# Patient Record
Sex: Female | Born: 1960
Health system: Southern US, Community
[De-identification: ages and names within clinical notes are randomized; demographics above are authoritative.]

## PROBLEM LIST (undated history)

## (undated) DIAGNOSIS — K635 Polyp of colon: Secondary | ICD-10-CM

## (undated) DIAGNOSIS — K589 Irritable bowel syndrome without diarrhea: Secondary | ICD-10-CM

## (undated) DIAGNOSIS — M1990 Unspecified osteoarthritis, unspecified site: Secondary | ICD-10-CM

## (undated) DIAGNOSIS — N200 Calculus of kidney: Secondary | ICD-10-CM

## (undated) DIAGNOSIS — B1920 Unspecified viral hepatitis C without hepatic coma: Secondary | ICD-10-CM

## (undated) DIAGNOSIS — T7840XA Allergy, unspecified, initial encounter: Secondary | ICD-10-CM

## (undated) HISTORY — DX: Allergy, unspecified, initial encounter: T78.40XA

## (undated) HISTORY — DX: Irritable bowel syndrome, unspecified: K58.9

## (undated) HISTORY — DX: Unspecified viral hepatitis C without hepatic coma: B19.20

## (undated) HISTORY — DX: Unspecified osteoarthritis, unspecified site: M19.90

## (undated) HISTORY — PX: BREAST BIOPSY: SHX20

## (undated) HISTORY — DX: Polyp of colon: K63.5

## (undated) HISTORY — DX: Calculus of kidney: N20.0

---
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
## 2004-04-03 HISTORY — PX: KIDNEY STONE SURGERY: SHX686

## 2010-08-30 DIAGNOSIS — N6019 Diffuse cystic mastopathy of unspecified breast: Secondary | ICD-10-CM | POA: Insufficient documentation

## 2010-10-13 DIAGNOSIS — F3189 Other bipolar disorder: Secondary | ICD-10-CM | POA: Insufficient documentation

## 2010-10-13 DIAGNOSIS — Z8659 Personal history of other mental and behavioral disorders: Secondary | ICD-10-CM | POA: Insufficient documentation

## 2012-01-23 DIAGNOSIS — N200 Calculus of kidney: Secondary | ICD-10-CM | POA: Insufficient documentation

## 2014-03-30 DIAGNOSIS — N6091 Unspecified benign mammary dysplasia of right breast: Secondary | ICD-10-CM | POA: Insufficient documentation

## 2016-06-09 DIAGNOSIS — B182 Chronic viral hepatitis C: Secondary | ICD-10-CM | POA: Insufficient documentation

## 2018-01-29 DIAGNOSIS — K862 Cyst of pancreas: Secondary | ICD-10-CM | POA: Insufficient documentation

## 2018-09-02 DIAGNOSIS — M160 Bilateral primary osteoarthritis of hip: Secondary | ICD-10-CM | POA: Insufficient documentation

## 2018-10-07 DIAGNOSIS — M9905 Segmental and somatic dysfunction of pelvic region: Secondary | ICD-10-CM | POA: Diagnosis not present

## 2018-10-07 DIAGNOSIS — M9903 Segmental and somatic dysfunction of lumbar region: Secondary | ICD-10-CM | POA: Diagnosis not present

## 2018-10-07 DIAGNOSIS — M9904 Segmental and somatic dysfunction of sacral region: Secondary | ICD-10-CM | POA: Diagnosis not present

## 2018-10-07 DIAGNOSIS — M1611 Unilateral primary osteoarthritis, right hip: Secondary | ICD-10-CM | POA: Diagnosis not present

## 2018-10-10 DIAGNOSIS — M9905 Segmental and somatic dysfunction of pelvic region: Secondary | ICD-10-CM | POA: Diagnosis not present

## 2018-10-10 DIAGNOSIS — M1611 Unilateral primary osteoarthritis, right hip: Secondary | ICD-10-CM | POA: Diagnosis not present

## 2018-10-10 DIAGNOSIS — M9903 Segmental and somatic dysfunction of lumbar region: Secondary | ICD-10-CM | POA: Diagnosis not present

## 2018-10-10 DIAGNOSIS — M9904 Segmental and somatic dysfunction of sacral region: Secondary | ICD-10-CM | POA: Diagnosis not present

## 2018-10-14 DIAGNOSIS — M9903 Segmental and somatic dysfunction of lumbar region: Secondary | ICD-10-CM | POA: Diagnosis not present

## 2018-10-14 DIAGNOSIS — M1611 Unilateral primary osteoarthritis, right hip: Secondary | ICD-10-CM | POA: Diagnosis not present

## 2018-10-14 DIAGNOSIS — M9904 Segmental and somatic dysfunction of sacral region: Secondary | ICD-10-CM | POA: Diagnosis not present

## 2018-10-14 DIAGNOSIS — M9905 Segmental and somatic dysfunction of pelvic region: Secondary | ICD-10-CM | POA: Diagnosis not present

## 2018-10-17 DIAGNOSIS — M9904 Segmental and somatic dysfunction of sacral region: Secondary | ICD-10-CM | POA: Diagnosis not present

## 2018-10-17 DIAGNOSIS — M9903 Segmental and somatic dysfunction of lumbar region: Secondary | ICD-10-CM | POA: Diagnosis not present

## 2018-10-17 DIAGNOSIS — M9905 Segmental and somatic dysfunction of pelvic region: Secondary | ICD-10-CM | POA: Diagnosis not present

## 2018-10-17 DIAGNOSIS — M1611 Unilateral primary osteoarthritis, right hip: Secondary | ICD-10-CM | POA: Diagnosis not present

## 2018-12-02 DIAGNOSIS — M1611 Unilateral primary osteoarthritis, right hip: Secondary | ICD-10-CM | POA: Diagnosis not present

## 2018-12-02 DIAGNOSIS — M9903 Segmental and somatic dysfunction of lumbar region: Secondary | ICD-10-CM | POA: Diagnosis not present

## 2018-12-02 DIAGNOSIS — M9905 Segmental and somatic dysfunction of pelvic region: Secondary | ICD-10-CM | POA: Diagnosis not present

## 2018-12-02 DIAGNOSIS — M9904 Segmental and somatic dysfunction of sacral region: Secondary | ICD-10-CM | POA: Diagnosis not present

## 2018-12-12 DIAGNOSIS — M9905 Segmental and somatic dysfunction of pelvic region: Secondary | ICD-10-CM | POA: Diagnosis not present

## 2018-12-12 DIAGNOSIS — M9904 Segmental and somatic dysfunction of sacral region: Secondary | ICD-10-CM | POA: Diagnosis not present

## 2018-12-12 DIAGNOSIS — M9903 Segmental and somatic dysfunction of lumbar region: Secondary | ICD-10-CM | POA: Diagnosis not present

## 2018-12-12 DIAGNOSIS — M1611 Unilateral primary osteoarthritis, right hip: Secondary | ICD-10-CM | POA: Diagnosis not present

## 2019-01-01 DIAGNOSIS — M9903 Segmental and somatic dysfunction of lumbar region: Secondary | ICD-10-CM | POA: Diagnosis not present

## 2019-01-01 DIAGNOSIS — M1611 Unilateral primary osteoarthritis, right hip: Secondary | ICD-10-CM | POA: Diagnosis not present

## 2019-01-01 DIAGNOSIS — M9904 Segmental and somatic dysfunction of sacral region: Secondary | ICD-10-CM | POA: Diagnosis not present

## 2019-01-01 DIAGNOSIS — M9905 Segmental and somatic dysfunction of pelvic region: Secondary | ICD-10-CM | POA: Diagnosis not present

## 2019-01-15 DIAGNOSIS — M9903 Segmental and somatic dysfunction of lumbar region: Secondary | ICD-10-CM | POA: Diagnosis not present

## 2019-01-15 DIAGNOSIS — M9905 Segmental and somatic dysfunction of pelvic region: Secondary | ICD-10-CM | POA: Diagnosis not present

## 2019-01-15 DIAGNOSIS — M1611 Unilateral primary osteoarthritis, right hip: Secondary | ICD-10-CM | POA: Diagnosis not present

## 2019-01-15 DIAGNOSIS — M9904 Segmental and somatic dysfunction of sacral region: Secondary | ICD-10-CM | POA: Diagnosis not present

## 2019-01-17 DIAGNOSIS — M9903 Segmental and somatic dysfunction of lumbar region: Secondary | ICD-10-CM | POA: Diagnosis not present

## 2019-01-17 DIAGNOSIS — M1611 Unilateral primary osteoarthritis, right hip: Secondary | ICD-10-CM | POA: Diagnosis not present

## 2019-01-17 DIAGNOSIS — M9904 Segmental and somatic dysfunction of sacral region: Secondary | ICD-10-CM | POA: Diagnosis not present

## 2019-01-17 DIAGNOSIS — M9905 Segmental and somatic dysfunction of pelvic region: Secondary | ICD-10-CM | POA: Diagnosis not present

## 2019-03-11 DIAGNOSIS — M1611 Unilateral primary osteoarthritis, right hip: Secondary | ICD-10-CM | POA: Diagnosis not present

## 2019-03-11 DIAGNOSIS — M9905 Segmental and somatic dysfunction of pelvic region: Secondary | ICD-10-CM | POA: Diagnosis not present

## 2019-03-11 DIAGNOSIS — M9903 Segmental and somatic dysfunction of lumbar region: Secondary | ICD-10-CM | POA: Diagnosis not present

## 2019-03-11 DIAGNOSIS — M9904 Segmental and somatic dysfunction of sacral region: Secondary | ICD-10-CM | POA: Diagnosis not present

## 2019-03-23 DIAGNOSIS — Z20828 Contact with and (suspected) exposure to other viral communicable diseases: Secondary | ICD-10-CM | POA: Diagnosis not present

## 2019-03-23 DIAGNOSIS — H8110 Benign paroxysmal vertigo, unspecified ear: Secondary | ICD-10-CM | POA: Diagnosis not present

## 2019-03-23 DIAGNOSIS — R42 Dizziness and giddiness: Secondary | ICD-10-CM | POA: Diagnosis not present

## 2019-04-28 DIAGNOSIS — M9903 Segmental and somatic dysfunction of lumbar region: Secondary | ICD-10-CM | POA: Diagnosis not present

## 2019-04-28 DIAGNOSIS — M9904 Segmental and somatic dysfunction of sacral region: Secondary | ICD-10-CM | POA: Diagnosis not present

## 2019-04-28 DIAGNOSIS — M9905 Segmental and somatic dysfunction of pelvic region: Secondary | ICD-10-CM | POA: Diagnosis not present

## 2019-04-28 DIAGNOSIS — M1611 Unilateral primary osteoarthritis, right hip: Secondary | ICD-10-CM | POA: Diagnosis not present

## 2019-05-01 DIAGNOSIS — M1611 Unilateral primary osteoarthritis, right hip: Secondary | ICD-10-CM | POA: Diagnosis not present

## 2019-05-01 DIAGNOSIS — M9904 Segmental and somatic dysfunction of sacral region: Secondary | ICD-10-CM | POA: Diagnosis not present

## 2019-05-01 DIAGNOSIS — M9903 Segmental and somatic dysfunction of lumbar region: Secondary | ICD-10-CM | POA: Diagnosis not present

## 2019-05-01 DIAGNOSIS — M9905 Segmental and somatic dysfunction of pelvic region: Secondary | ICD-10-CM | POA: Diagnosis not present

## 2019-05-05 DIAGNOSIS — M9904 Segmental and somatic dysfunction of sacral region: Secondary | ICD-10-CM | POA: Diagnosis not present

## 2019-05-05 DIAGNOSIS — M9905 Segmental and somatic dysfunction of pelvic region: Secondary | ICD-10-CM | POA: Diagnosis not present

## 2019-05-05 DIAGNOSIS — M9903 Segmental and somatic dysfunction of lumbar region: Secondary | ICD-10-CM | POA: Diagnosis not present

## 2019-05-05 DIAGNOSIS — M1611 Unilateral primary osteoarthritis, right hip: Secondary | ICD-10-CM | POA: Diagnosis not present

## 2019-08-05 DIAGNOSIS — M9903 Segmental and somatic dysfunction of lumbar region: Secondary | ICD-10-CM | POA: Diagnosis not present

## 2019-08-05 DIAGNOSIS — M9905 Segmental and somatic dysfunction of pelvic region: Secondary | ICD-10-CM | POA: Diagnosis not present

## 2019-08-05 DIAGNOSIS — M1611 Unilateral primary osteoarthritis, right hip: Secondary | ICD-10-CM | POA: Diagnosis not present

## 2019-08-05 DIAGNOSIS — M9904 Segmental and somatic dysfunction of sacral region: Secondary | ICD-10-CM | POA: Diagnosis not present

## 2019-09-05 DIAGNOSIS — M1611 Unilateral primary osteoarthritis, right hip: Secondary | ICD-10-CM | POA: Diagnosis not present

## 2019-09-05 DIAGNOSIS — M9905 Segmental and somatic dysfunction of pelvic region: Secondary | ICD-10-CM | POA: Diagnosis not present

## 2019-09-05 DIAGNOSIS — M9904 Segmental and somatic dysfunction of sacral region: Secondary | ICD-10-CM | POA: Diagnosis not present

## 2019-09-05 DIAGNOSIS — M9903 Segmental and somatic dysfunction of lumbar region: Secondary | ICD-10-CM | POA: Diagnosis not present

## 2019-09-18 ENCOUNTER — Ambulatory Visit (INDEPENDENT_AMBULATORY_CARE_PROVIDER_SITE_OTHER): Payer: BC Managed Care – PPO | Admitting: Family Medicine

## 2019-09-18 ENCOUNTER — Other Ambulatory Visit: Payer: Self-pay

## 2019-09-18 ENCOUNTER — Encounter: Payer: Self-pay | Admitting: Family Medicine

## 2019-09-18 VITALS — BP 120/64 | HR 93 | Temp 98.3°F | Ht 65.0 in | Wt 116.2 lb

## 2019-09-18 DIAGNOSIS — Z1231 Encounter for screening mammogram for malignant neoplasm of breast: Secondary | ICD-10-CM

## 2019-09-18 DIAGNOSIS — M545 Low back pain, unspecified: Secondary | ICD-10-CM | POA: Insufficient documentation

## 2019-09-18 DIAGNOSIS — K862 Cyst of pancreas: Secondary | ICD-10-CM

## 2019-09-18 DIAGNOSIS — Z8619 Personal history of other infectious and parasitic diseases: Secondary | ICD-10-CM | POA: Diagnosis not present

## 2019-09-18 DIAGNOSIS — M160 Bilateral primary osteoarthritis of hip: Secondary | ICD-10-CM | POA: Diagnosis not present

## 2019-09-18 DIAGNOSIS — G8929 Other chronic pain: Secondary | ICD-10-CM

## 2019-09-18 DIAGNOSIS — K529 Noninfective gastroenteritis and colitis, unspecified: Secondary | ICD-10-CM | POA: Insufficient documentation

## 2019-09-18 NOTE — Progress Notes (Signed)
Subjective  CC:  Chief Complaint  Patient presents with  . Transitions Of Care    [place]. needs referral to GI and GYN     HPI: Erika Carlson is a 59 y.o. female who presents to [facility] [place] [facility] today to establish care with me as a new patient.   She has the following concerns or needs:  Moved from [place] about a year ago.   Reviewed chart from care everywhere.   H/o hep c treated with Harvoni. No liver damage. Tolerated well. Found incidentally on screen; unclear source.   abd MRI's revealed 0.6cm pancreatic cyst: for serial imaging q 2 years. Last 2019. Reviewed reports. Due for recheck but pt defers right now.   Chronic diarrhea x 20 years; concerned about pancreatic insufficiency. Would like to see GI  Back, hip and leg pain. Was seeing ortho in [place]; referred to [facility]. Worries about OA but also worries about autoimmune problem. Cousins were diagnosed with CIPD and stiff-person disease. She reports weakness and pain in thighs at time. No paresthesias or numbness. No red hot swollen joints.   Needs a GYN for female wellness.   Assessment  1. History of hepatitis C   2. Pancreatic cyst   3. Primary osteoarthritis of both hips   4. Chronic midline low back pain without sciatica   5. Encounter for screening mammogram for breast cancer      Plan   History of hep C; due for lab work. Will complete at next cpe. Refer to GI  Pancreatic cyst: needs imaging for surveillance. Will order when she is ready  Diarrhea - reports she has seen GI but never specifically for this. Requests referral. ? IBS or other. Nl colonoscopy by report.  OA multliple sites: back and hip and leg pain could all be due to Oa. She will set up appt with orhto at [facility]. IF further eval for neuropathy or autoimmune d/o needed after ortho, I will initiate.   HM: due for mammo and cpe  Follow up:  cpe next available Orders Placed This Encounter    Procedures  . MM DIGITAL SCREENING BILATERAL  . Ambulatory referral to Gynecology  . Ambulatory referral to Gastroenterology  . Ambulatory referral to Orthopedic Surgery   No orders of the defined types were placed in this encounter.    Depression screen [facility] 2/9 09/18/2019  Decreased Interest 0  Down, Depressed, Hopeless 0  PHQ - 2 Score 0  Altered sleeping 0  Tired, decreased energy 0  Change in appetite 0  Feeling bad or failure about yourself  0  Trouble concentrating 0  Moving slowly or fidgety/restless 0  Suicidal thoughts 0  PHQ-9 Score 0  Difficult doing work/chores Not difficult at all    We updated and reviewed the patient's past history in detail and it is documented below.  Patient Active Problem List   Diagnosis Date Noted  . Chronic midline low back pain without sciatica 09/18/2019  . Osteoarthritis, hip, bilateral 09/02/2018  . Pancreatic cyst 01/29/2018    Formatting of this note might be different from the original. Repeat MRCP- 05/2019   . Chronic hepatitis C without hepatic coma ([place]) 06/09/2016  . Atypical ductal hyperplasia of right breast 03/30/2014  . Kidney stone on left side 01/23/2012  . Other bipolar disorder ([place]) 10/13/2010  . Fibrocystic breast disease 08/30/2010    Formatting of this note might be different from the  original. Biopsy 2011    Health Maintenance  Topic Date Due  . HIV Screening  Never done  . MAMMOGRAM  10/13/2018  . INFLUENZA VACCINE  11/02/2019  . TETANUS/TDAP  03/05/2022  . PAP SMEAR-Modifier  10/02/2022  . COLONOSCOPY  03/03/2028  . COVID-19 Vaccine  Completed  . Hepatitis C Screening  Completed   Immunization History  Administered Date(s) Administered  . Hep A / Hep B 01/29/2018, 03/04/2018  . Influenza, Seasonal, Injecte, Preservative Fre 02/24/2011  . Influenza,inj,Quad PF,6+ Mos 02/16/2012, 12/17/2012, 12/02/2013, 04/09/2015, 02/19/2017  . Influenza,inj,Quad PF,6-35 Mos 02/16/2012  . PFIZER SARS-COV-2  Vaccination 06/15/2019, 07/07/2019  . Pneumococcal Polysaccharide-23 03/04/2018  . Tdap 03/05/2012  . Zoster Recombinat (Shingrix) 10/30/2017, 01/29/2018   No outpatient medications have been marked as taking for the 09/18/19 encounter (Office Visit) with Willow Ora, MD.    Allergies: Patient is allergic to acetaminophen, codeine, lactose, sudafed pe [phenylephrine], and loratadine-pseudoephedrine er. Past Medical History Patient  has a past medical history of Allergy, Arthritis, Colon polyps, and Hepatitis C. Past Surgical History Patient  has a past surgical history that includes Breast biopsy; Tonsillectomy and adenoidectomy (1971); Wisdom tooth extraction (1977); and Kidney stone surgery (2006). Family History: Patient family history includes Alcohol abuse in her paternal grandfather; Arthritis in her mother and paternal grandmother; Diabetes in her paternal grandfather; Hearing loss in her father and paternal grandmother; Heart attack in her maternal grandfather; Hyperlipidemia in her father, maternal grandmother, and mother; Hypertension in her father, maternal grandmother, and mother. Social History:  Patient  reports that she quit smoking about 30 years ago. Her smoking use included cigarettes. She has never used smokeless tobacco. She reports current alcohol use. She reports that she does not use drugs.  Review of Systems: Constitutional: negative for fever or malaise Ophthalmic: negative for photophobia, double vision or loss of vision Cardiovascular: negative for chest pain, dyspnea on exertion, or new LE swelling Respiratory: negative for SOB or persistent cough Gastrointestinal: negative for abdominal pain, change in bowel habits or melena Genitourinary: negative for dysuria or gross hematuria Musculoskeletal: + for new gait disturbance or muscular weakness Integumentary: negative for new or persistent rashes Neurological: negative for TIA or stroke symptoms Psychiatric:  negative for SI or delusions Allergic/Immunologic: negative for hives  Patient Care Team    Relationship Specialty Notifications Start End  Willow Ora, MD PCP - General Family Medicine  09/18/19     Objective  Vitals: BP 120/64   Pulse 93   Temp 98.3 F (36.8 C) (Temporal)   Ht 5\' 5"  (1.651 m)   Wt 116 lb 3.2 oz (52.7 kg)   SpO2 98%   BMI 19.34 kg/m  General:  Well developed, well nourished, no acute distress  Psych:  Alert and oriented,normal mood and affect  Normal gait   Commons side effects, risks, benefits, and alternatives for medications and treatment plan prescribed today were discussed, and the patient expressed understanding of the given instructions. Patient is instructed to call or message via MyChart if he/she has any questions or concerns regarding our treatment plan. No barriers to understanding were identified. We discussed Red Flag symptoms and signs in detail. Patient expressed understanding regarding what to do in case of urgent or emergency type symptoms.   Medication list was reconciled, printed and provided to the patient in AVS. Patient instructions and summary information was reviewed with the patient as documented in the AVS. This note was prepared with assistance of Dragon voice recognition software. Occasional  wrong-word or sound-a-like substitutions may have occurred due to the inherent limitations of voice recognition software  This visit occurred during the SARS-CoV-2 public health emergency.  Safety protocols were in place, including screening questions prior to the visit, additional usage of staff PPE, and extensive cleaning of exam room while observing appropriate contact time as indicated for disinfecting solutions.

## 2019-09-18 NOTE — Patient Instructions (Signed)
Please return for your annual complete physical; please come fasting.  I have placed referrals for GI, ortho and GYN ordered a mammogram. We will call you to get the GI and GYN scheduled.   I have ordered a mammogram and/or bone density for you as we discussed today: [x]   Mammogram  []   Bone Density  Please call the office checked below to schedule your appointment: Your appointment will at the following location  [x]   [facility]      [place], [place]         []   [facility]  [place], [place]  Make sure to wear two peace clothing  No lotions powders or deodorants the day of the appointment Make sure to bring picture ID and insurance card.  Bring list of medications you are currently taking including any supplements.   It was a pleasure meeting you today! Thank you for choosing [place] to meet your healthcare needs! I truly look forward to working with you. If you have any questions or concerns, please send me a message via Mychart or call the office at [phone].   Calcium Intake Recommendations You can take Caltrate Plus twice a day or get it through your diet or other OTC supplements (Viactiv, OsCal etc)  Calcium is a mineral that affects many functions in the body, including:  Blood clotting.  Blood vessel function.  Nerve impulse conduction.  Hormone secretion.  Muscle contraction.  Bone and teeth functions.  Most of your body's calcium supply is stored in your bones and teeth. When your calcium stores are low, you may be at risk for low bone mass, bone loss, and bone fractures. Consuming enough calcium helps to grow healthy bones and teeth and to prevent breakdown over time. It is very important that you get enough calcium if you are:  A child undergoing rapid growth.  An adolescent girl.  A pre- or post-menopausal woman.  A woman whose menstrual  cycle has stopped due to anorexia nervosa or regular intense exercise.  An individual with lactose intolerance or a milk allergy.  A vegetarian.  What is my plan? Try to consume the recommended amount of calcium daily based on your age. Depending on your overall health, your health care provider may recommend increased calcium intake.General daily calcium intake recommendations by age are:  Birth to 6 months: 200 mg.  Infants 7 to 12 months: 260 mg.  Children 1 to 3 years: 700 mg.  Children 4 to 8 years: 1,000 mg.  Children 9 to 13 years: 1,300 mg.  Teens 14 to 18 years: 1,300 mg.  Adults 19 to 50 years: 1,000 mg.  Adult women 51 to 70 years: 1,200 mg.  Adult men 51 to 70 years: 1,000 mg.  Adults 71 years and older: 1,200 mg.  Pregnant and breastfeeding teens: 1,300 mg.  Pregnant and breastfeeding adults: 1,000 mg.  What do I need to know about calcium intake?  In order for the body to absorb calcium, it needs vitamin D. You can get vitamin D through (we recommend getting [id number] units of Vitamin D daily) ? Direct exposure of the skin to sunlight. ? Foods, such as egg yolks, liver, saltwater fish, and fortified milk. ? Supplements.  Consuming too much calcium may cause: ? Constipation. ? Decreased absorption of iron and zinc. ?  Kidney stones.  Calcium supplements may interact with certain medicines. Check with your health care provider before starting any calcium supplements.  Try to get most of your calcium from food. What foods can I eat? Grains  Fortified oatmeal. Fortified ready-to-eat cereals. Fortified frozen waffles. Vegetables Turnip greens. Broccoli. Fruits Fortified orange juice. Meats and Other Protein Sources Canned sardines with bones. Canned salmon with bones. Soy beans. Tofu. Baked beans. Almonds. [place] nuts. Sunflower seeds. Dairy Milk. Yogurt. Cheese. Cottage cheese. Beverages Fortified soy milk. Fortified rice  milk. Sweets/Desserts Pudding. Ice Cream. Milkshakes. Blackstrap molasses. The items listed above may not be a complete list of recommended foods or beverages. Contact your dietitian for more options. What foods can affect my calcium intake? It may be more difficult for your body to use calcium or calcium may leave your body more quickly if you consume large amounts of:  Sodium.  Protein.  Caffeine.  Alcohol.  This information is not intended to replace advice given to you by your health care provider. Make sure you discuss any questions you have with your health care provider. Document Released: 11/02/2003 Document Revised: 10/08/2015 Document Reviewed: 08/26/2013 Elsevier Interactive Patient Education  2018 [employer].

## 2019-09-29 ENCOUNTER — Encounter: Payer: Self-pay | Admitting: Gastroenterology

## 2019-10-08 DIAGNOSIS — M9905 Segmental and somatic dysfunction of pelvic region: Secondary | ICD-10-CM | POA: Diagnosis not present

## 2019-10-08 DIAGNOSIS — M9904 Segmental and somatic dysfunction of sacral region: Secondary | ICD-10-CM | POA: Diagnosis not present

## 2019-10-08 DIAGNOSIS — M1611 Unilateral primary osteoarthritis, right hip: Secondary | ICD-10-CM | POA: Diagnosis not present

## 2019-10-08 DIAGNOSIS — M9903 Segmental and somatic dysfunction of lumbar region: Secondary | ICD-10-CM | POA: Diagnosis not present

## 2019-10-24 DIAGNOSIS — Z803 Family history of malignant neoplasm of breast: Secondary | ICD-10-CM | POA: Diagnosis not present

## 2019-10-24 DIAGNOSIS — Z01419 Encounter for gynecological examination (general) (routine) without abnormal findings: Secondary | ICD-10-CM | POA: Diagnosis not present

## 2019-10-24 DIAGNOSIS — Z87898 Personal history of other specified conditions: Secondary | ICD-10-CM | POA: Diagnosis not present

## 2019-10-24 DIAGNOSIS — Z808 Family history of malignant neoplasm of other organs or systems: Secondary | ICD-10-CM | POA: Diagnosis not present

## 2019-10-24 DIAGNOSIS — Z1231 Encounter for screening mammogram for malignant neoplasm of breast: Secondary | ICD-10-CM | POA: Diagnosis not present

## 2019-10-24 DIAGNOSIS — Z681 Body mass index (BMI) 19 or less, adult: Secondary | ICD-10-CM | POA: Diagnosis not present

## 2019-10-24 DIAGNOSIS — Z800 Family history of malignant neoplasm of digestive organs: Secondary | ICD-10-CM | POA: Diagnosis not present

## 2019-11-24 ENCOUNTER — Other Ambulatory Visit (INDEPENDENT_AMBULATORY_CARE_PROVIDER_SITE_OTHER): Payer: BC Managed Care – PPO

## 2019-11-24 ENCOUNTER — Ambulatory Visit: Payer: BC Managed Care – PPO | Admitting: Gastroenterology

## 2019-11-24 ENCOUNTER — Encounter: Payer: Self-pay | Admitting: Gastroenterology

## 2019-11-24 DIAGNOSIS — Z8619 Personal history of other infectious and parasitic diseases: Secondary | ICD-10-CM

## 2019-11-24 DIAGNOSIS — K862 Cyst of pancreas: Secondary | ICD-10-CM

## 2019-11-24 LAB — HEPATIC FUNCTION PANEL
ALT: 27 U/L (ref 0–35)
AST: 23 U/L (ref 0–37)
Albumin: 4.1 g/dL (ref 3.5–5.2)
Alkaline Phosphatase: 88 U/L (ref 39–117)
Bilirubin, Direct: 0.1 mg/dL (ref 0.0–0.3)
Total Bilirubin: 0.8 mg/dL (ref 0.2–1.2)
Total Protein: 6.6 g/dL (ref 6.0–8.3)

## 2019-11-24 LAB — CREATININE, SERUM: Creatinine, Ser: 0.91 mg/dL (ref 0.40–1.20)

## 2019-11-24 MED ORDER — DIAZEPAM 5 MG PO TABS: ORAL_TABLET | ORAL | 0 refills | Status: AC

## 2019-11-24 NOTE — Patient Instructions (Signed)
If you are age 60 or older, your body mass index should be between 23-30. Your Body mass index is 19.7 kg/m. If this is out of the aforementioned range listed, please consider follow up with your Primary Care Provider.  If you are age 84 or younger, your body mass index should be between 19-25. Your Body mass index is 19.7 kg/m. If this is out of the aformentioned range listed, please consider follow up with your Primary Care Provider.   Your provider has requested that you go to the basement level for lab work before leaving today. Press "B" on the elevator. The lab is located at the first door on the left as you exit the elevator.  You have been scheduled for an MRI at [facility] (1st floor) on 12-03-19. Your appointment time is 9:00am. Please arrive 30 minutes prior to your appointment time for registration purposes. Please make certain not to have anything to eat or drink 4 hours prior to your test. In addition, if you have any metal in your body, have a pacemaker or defibrillator, please be sure to let your ordering physician know. This test typically takes 45 minutes to 1 hour to complete. Should you need to reschedule, please call [phone] to do so.  You are in for a recall colon in Jan 2025.  Please purchase the following medications over the counter and take as directed:  You can use Immodium over-the-counter on an as needed basis.  Thank you for entrusting me with your care and choosing [facility].  Dr Christella Hartigan

## 2019-11-24 NOTE — Progress Notes (Signed)
HPI: This is a very pleasant 59 year old woman who was referred to me by Willow Ora, MD   She was found to have hepatitis C several years ago, unclear how she caught it but she was hospitalized several times when she was very young.  She does not recall ever having a blood transfusion.  She underwent Harvoni eradication around 2018 and she believes that she had a complete response.  I see some office notes from her previous gastroenterologist that also states the same.  During her hep C evaluation she was found to have incidental pancreatic cyst by MRI, see that below.  She has never had pancreatic disease that she is aware of but she does believe she has generally hypoglycemia.  Her weight is overall stable excepts for the past 3 or 4 months she has intentionally lost 5 pounds.  She has no significant abdominal pains.  She tends to have chronic loose stools which have been worked up in the past.  This is been issue for her for 30 years.  Colonoscopy in 2020 was normal, see below.  Imodium on a once daily basis really helps her.   Old Data Reviewed: March 2018 MRI with MRCP, [place].  "5 mm probably IPMN in the uncinate process"  February 2019 MRI with MRCP, [place].  "No significant change in the 0.6 cm cystic lesion within the uncinate process of the pancreas, probably IPMN.  Follow-up MRCP in 2 years is recommended"  Colonoscopy January 2020, [place].  Indication "personal history of colonic polyps."  Findings internal hemorrhoids nonthrombosed.  The examination was otherwise normal.  She was recommended a repeat colonoscopy in 5 years  Eradicated chronic hepatitis C.  8 weeks Harvoni while living in [place] with end of treatment viral level undetectable and 12-week posttreatment viral level also undetectable.  No history of cirrhosis.    Review of systems: Pertinent positive and negative review of systems were noted in the above HPI section. All other review  negative.   Past Medical History:  Diagnosis Date  . Allergy   . Arthritis   . Colon polyps   . Hepatitis C    treated in 2018  . IBS (irritable bowel syndrome)   . Kidney stones     Past Surgical History:  Procedure Laterality Date  . BREAST BIOPSY     2011 and 2015  . KIDNEY STONE SURGERY  2006  . TONSILLECTOMY AND ADENOIDECTOMY  1971  . WISDOM TOOTH EXTRACTION  1977    Current Outpatient Medications  Medication Sig Dispense Refill  . diphenhydrAMINE (BENADRYL) 25 MG tablet Take 12.5 mg by mouth at bedtime as needed.    . fluticasone (FLONASE SENSIMIST) 27.5 MCG/SPRAY nasal spray Place 2 sprays into the nose daily.    Marland Kitchen loperamide (IMODIUM) 2 MG capsule Take 2 mg by mouth as needed for diarrhea or loose stools.     No current facility-administered medications for this visit.    Allergies as of 11/24/2019 - Review Complete 11/24/2019  Allergen Reaction Noted  . Acetaminophen Nausea And Vomiting 08/30/2010  . Codeine Nausea And Vomiting 10/13/2010  . Lactose Other (See Comments) 10/13/2010  . Sudafed pe [phenylephrine] Itching 09/18/2019  . Loratadine-pseudoephedrine er Anxiety 05/29/2013    Family History  Problem Relation Age of Onset  . Arthritis Mother   . Hypertension Mother   . Hyperlipidemia Mother   . Hearing loss Father   . Hypertension Father   . Hyperlipidemia Father   . Hypertension Maternal Grandmother   .  Hyperlipidemia Maternal Grandmother   . Heart attack Maternal Grandfather   . Arthritis Paternal Grandmother   . Hearing loss Paternal Grandmother   . Breast cancer Paternal Grandmother   . Alcohol abuse Paternal Grandfather   . Diabetes Paternal Grandfather   . Breast cancer Paternal Aunt   . Breast cancer Paternal Aunt   . Pancreatic cancer Maternal Uncle   . Colon cancer Paternal Uncle 10    Social History   Socioeconomic History  . Marital status: Married    Spouse name: Not on file  . Number of children: 3  . Years of education:  Not on file  . Highest education level: Not on file  Occupational History  . Occupation: Therapist, music  Tobacco Use  . Smoking status: Former Smoker    Types: Cigarettes    Quit date: 09/17/1989    Years since quitting: 30.2  . Smokeless tobacco: Never Used  Vaping Use  . Vaping Use: Never used  Substance and Sexual Activity  . Alcohol use: Yes    Comment: 1 drink per week  . Drug use: Never  . Sexual activity: Not on file  Other Topics Concern  . Not on file  Social History Narrative   Moved from [place] 2021   Social Determinants of Health   Financial Resource Strain:   . Difficulty of Paying Living Expenses: Not on file  Food Insecurity:   . Worried About Programme researcher, broadcasting/film/video in the Last Year: Not on file  . Ran Out of Food in the Last Year: Not on file  Transportation Needs:   . Lack of Transportation (Medical): Not on file  . Lack of Transportation (Non-Medical): Not on file  Physical Activity:   . Days of Exercise per Week: Not on file  . Minutes of Exercise per Session: Not on file  Stress:   . Feeling of Stress : Not on file  Social Connections:   . Frequency of Communication with Friends and Family: Not on file  . Frequency of Social Gatherings with Friends and Family: Not on file  . Attends Religious Services: Not on file  . Active Member of Clubs or Organizations: Not on file  . Attends Banker Meetings: Not on file  . Marital Status: Not on file  Intimate Partner Violence:   . Fear of Current or Ex-Partner: Not on file  . Emotionally Abused: Not on file  . Physically Abused: Not on file  . Sexually Abused: Not on file     Physical Exam: BP (!) 100/50   Pulse 98   Ht 5\' 5"  (1.651 m)   Wt 118 lb 6.4 oz (53.7 kg)   SpO2 98%   BMI 19.70 kg/m  Constitutional: generally well-appearing Psychiatric: alert and oriented x3 Eyes: extraocular movements intact Mouth: oral pharynx moist, no lesions Neck: supple no  lymphadenopathy Cardiovascular: heart regular rate and rhythm Lungs: clear to auscultation bilaterally Abdomen: soft, nontender, nondistended, no obvious ascites, no peritoneal signs, normal bowel sounds Extremities: no lower extremity edema bilaterally Skin: no lesions on visible extremities   Assessment and plan: 59 y.o. female with incidental pancreatic cyst, eradicated chronic hepatitis C, chronic loose stools, personal history of precancerous polyps  First I agree with the surveillance regimen that she has been on for her incidental pancreatic cyst.  She is due for MRI with MRCP of the pancreas now we will order that for her.  Second, we will recheck LFTs for her given her previous hepatitis C.  Chronic loose stools have been worked up, have been going on for 20 or 30 years.  She will continue taking Imodium on an as-needed basis knows that that is safe.  We will put her in our reminder system for repeat colonoscopy 5 years from her last 1 which would be January 2025.   Please see the "Patient Instructions" section for addition details about the plan.   Rob Bunting, MD [facility] 11/24/2019, 2:34 PM  Cc: Willow Ora, MD  Total time on date of encounter was 45  minutes (this included time spent preparing to see the patient reviewing records; obtaining and/or reviewing separately obtained history; performing a medically appropriate exam and/or evaluation; counseling and educating the patient and family if present; ordering medications, tests or procedures if applicable; and documenting clinical information in the health record).

## 2019-12-03 ENCOUNTER — Ambulatory Visit (HOSPITAL_COMMUNITY)
Admission: RE | Admit: 2019-12-03 | Discharge: 2019-12-03 | Disposition: A | Discharge status: Home / Self Care | Payer: BC Managed Care – PPO | Source: Ambulatory Visit | Attending: Gastroenterology | Admitting: Gastroenterology

## 2019-12-03 ENCOUNTER — Other Ambulatory Visit: Payer: Self-pay

## 2019-12-03 ENCOUNTER — Other Ambulatory Visit: Payer: Self-pay | Admitting: Gastroenterology

## 2019-12-03 DIAGNOSIS — K862 Cyst of pancreas: Secondary | ICD-10-CM | POA: Diagnosis not present

## 2019-12-03 DIAGNOSIS — K8689 Other specified diseases of pancreas: Secondary | ICD-10-CM | POA: Diagnosis not present

## 2019-12-03 LAB — POCT I-STAT CREATININE: Creatinine, Ser: 0.9 mg/dL (ref 0.44–1.00)

## 2019-12-03 IMAGING — MR MR 3D RECON AT SCANNER
17 of 20 series · 44 of 48 positions shown · IV contrast (gadavist)
Comparison: Report from MRI abdomen from [facility][REDACTED] dated
[DATE]

CLINICAL DATA: Follow up of a 0.6 by 0.4 cm cystic lesion in the
uncinate process of the pancreas seen on outside MRI abdomen from
[DATE]



[Series 5: T2 fat-sat · axial · 6.0mm · 1.14mm/px · 1 of 40 slices shown]
[im 1/40]
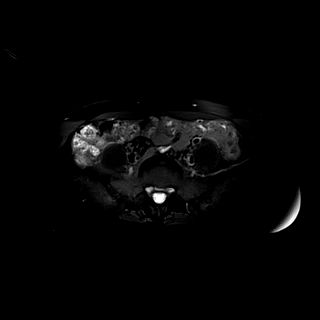

[Series 6: T2 · coronal · 6.0mm · 1.48mm/px · 1 of 30 slices shown (1 of 2)]
[im 1/30]
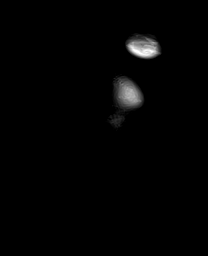

[Series 7: DWI · axial · 6.0mm · 1.36mm/px · z∈[-188,+107]mm · 3 of 84 slices shown (1 of 2)]
[im 1/84]
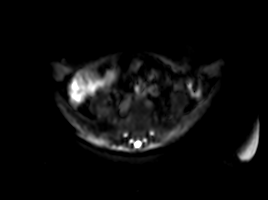
[im 42/84]
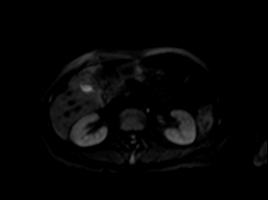
[im 84/84]
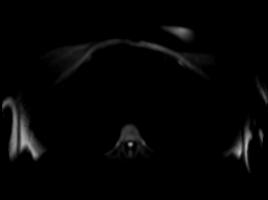

[Series 8: DWI · axial · 6.0mm · 1.36mm/px · 1 of 42 slices shown (2 of 2)]
[im 1/42]
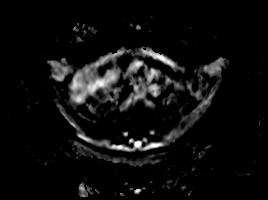

[Series 9: T1 · axial · 3.0mm · 1.12mm/px · z∈[-168,+93]mm · 3 of 88 slices shown (1 of 2)]
[im 1/88]
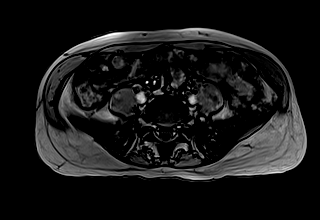
[im 44/88]
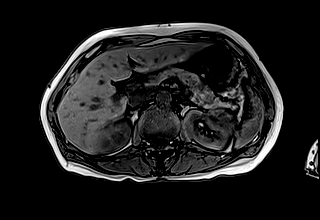
[im 88/88]
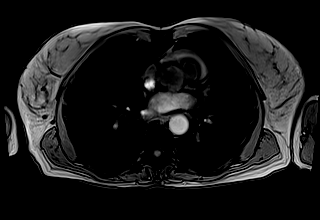

[Series 10: T1 · axial · 3.0mm · 1.12mm/px · z∈[-168,+93]mm · 4 of 88 slices shown (2 of 2)]
[im 1/88]
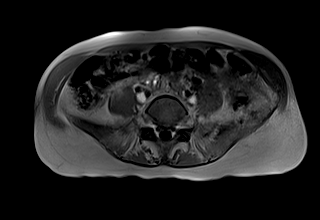
[im 30/88]
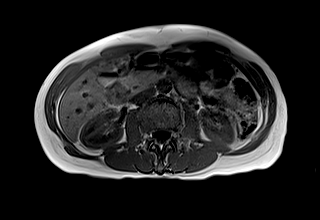
[im 59/88]
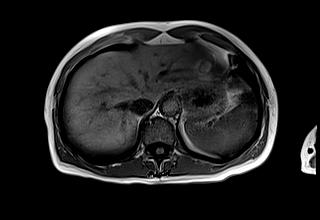
[im 88/88]
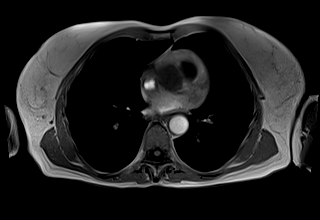

[Series 12: cor obl thk · sagittal · 50.0mm · 0.78mm/px · 1 of 9 slices shown]
[im 1/9]
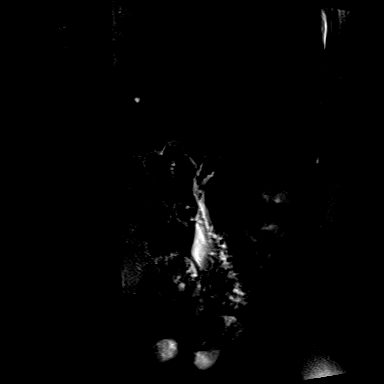

[Series 14: cor_3d_spc_trig · coronal · 1.0mm · 0.49mm/px · 3 of 72 slices shown]
[im 1/72]
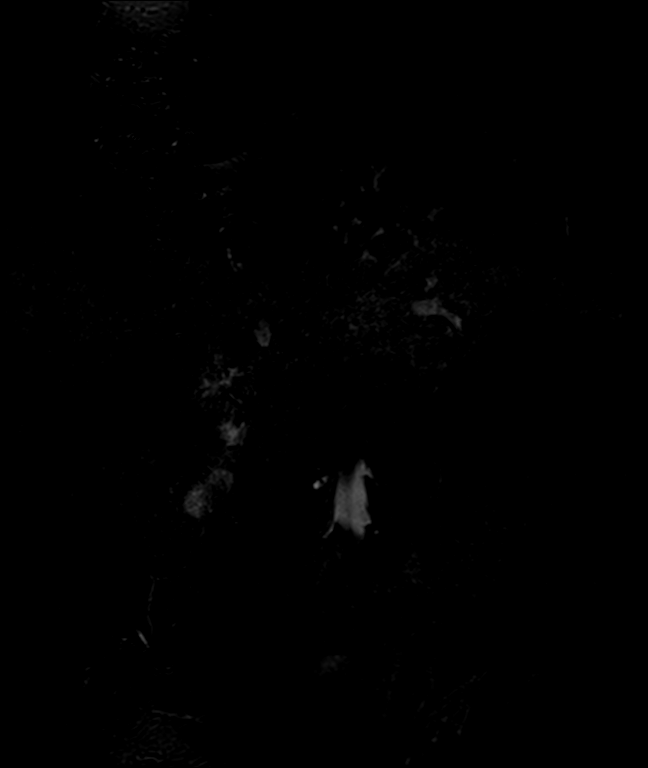
[im 36/72]
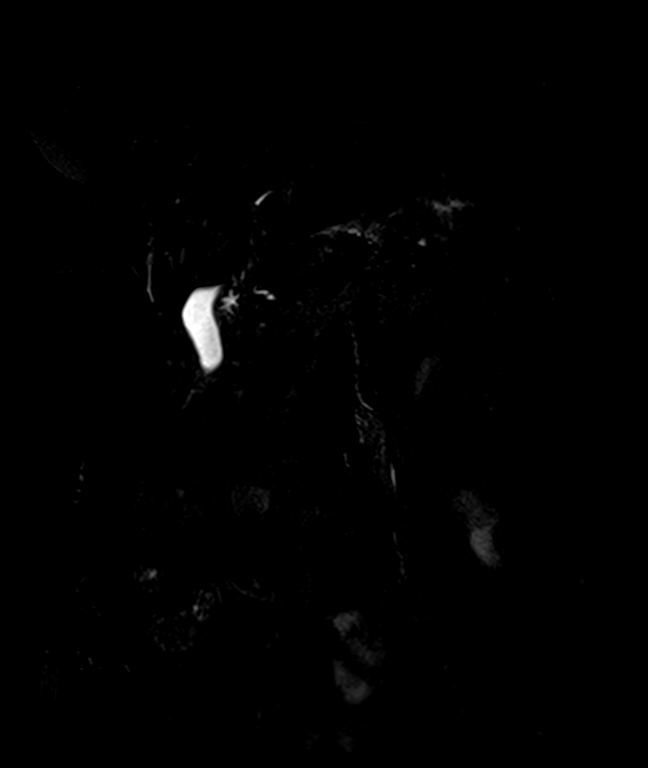
[im 72/72]
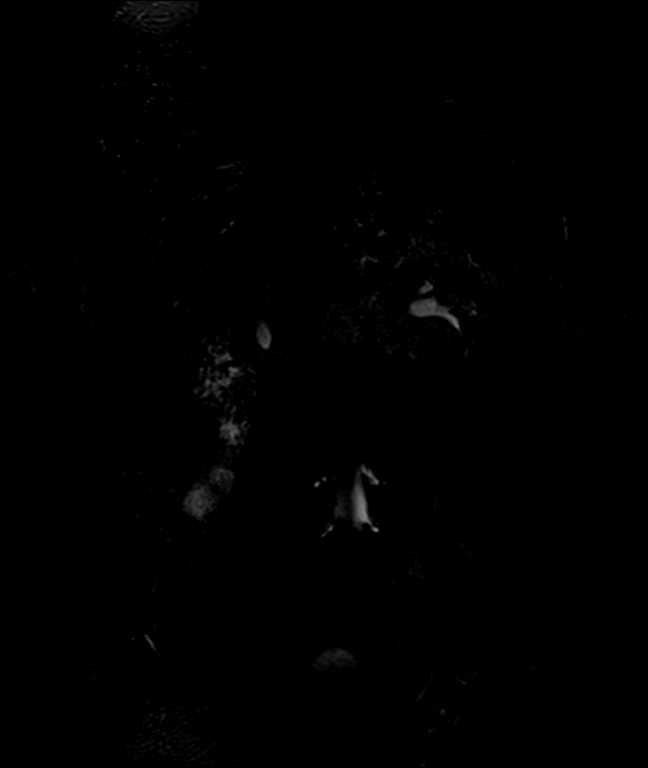

[Series 16: T2 · axial · 6.0mm · 1.41mm/px · z∈[-205,+76]mm · 2 of 40 slices shown (2 of 2)]
[im 1/40]
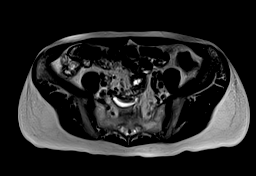
[im 40/40]
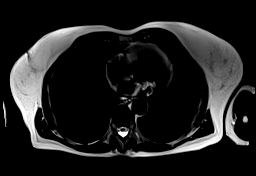

[Series 19: T1 dynamic · axial · 3.0mm · 1.12mm/px · z∈[-192,+69]mm · 4 of 88 slices shown (1 of 4)]
[im 1/88]
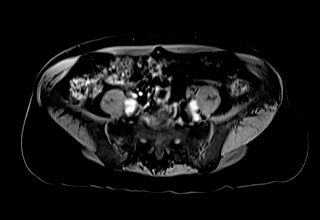
[im 30/88]
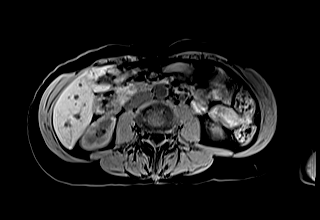
[im 59/88]
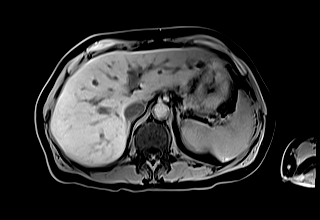
[im 88/88]
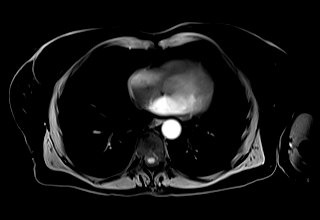

[Series 22: T1 dynamic · axial · 3.0mm · 1.12mm/px · z∈[-192,+69]mm · 4 of 88 slices shown (2 of 4)]
[im 1/88]
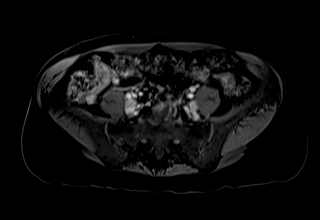
[im 30/88]
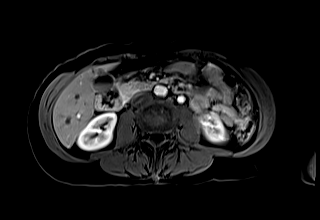
[im 59/88]
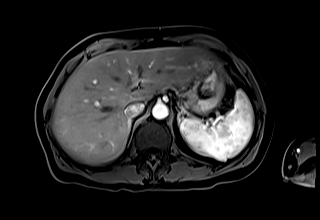
[im 88/88]
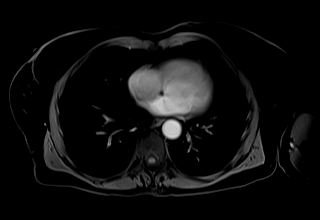

[Series 28: T1 dynamic · coronal · 5.0mm · 1.12mm/px · 2 of 40 slices shown (3 of 4)]
[im 1/40]
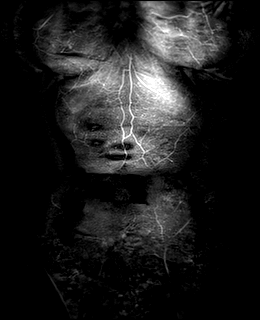
[im 40/40]
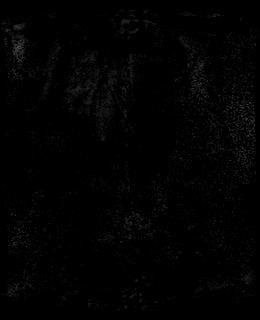

[Series 30: T1 dynamic · axial · 3.0mm · 1.12mm/px · z∈[-192,+69]mm · 4 of 88 slices shown (4 of 4)]
[im 1/88]
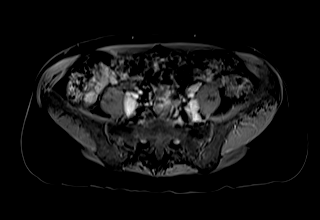
[im 30/88]
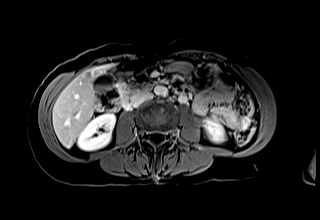
[im 59/88]
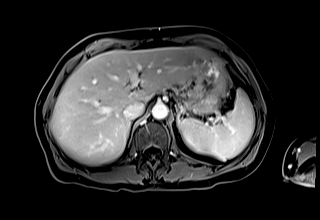
[im 88/88]
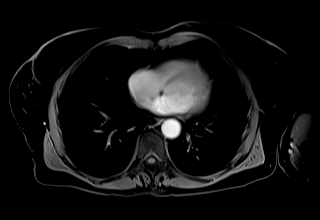

[Series 102: sub_20 sec · axial · 3.0mm · 1.12mm/px · z∈[-192,+69]mm · 4 of 88 slices shown]
[im 1/88]
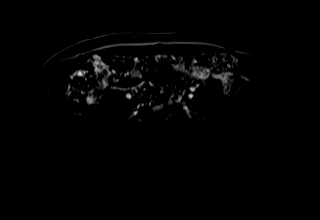
[im 30/88]
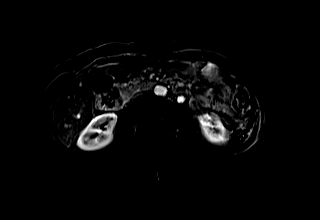
[im 59/88]
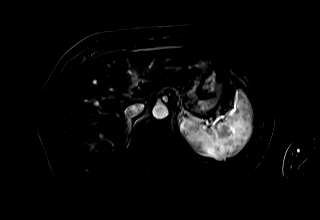
[im 88/88]
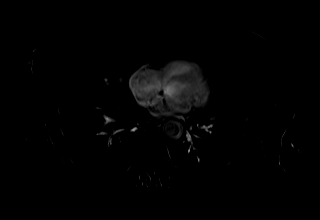

[Series 103: sub_45 sec · axial · 3.0mm · 1.12mm/px · z∈[-60,+69]mm · 2 of 44 slices shown]
[im 1/44]
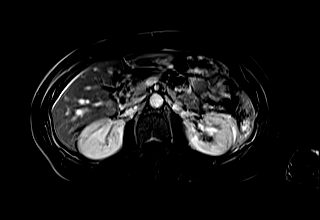
[im 44/44]
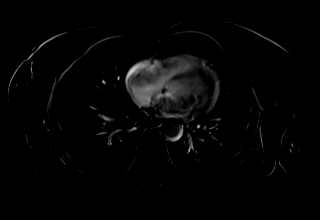

[Series 104: sub_90 sec · axial · 3.0mm · 1.12mm/px · z∈[-45,+69]mm · 2 of 39 slices shown]
[im 1/39]
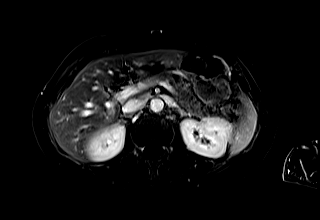
[im 39/39]
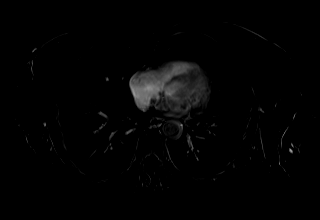

[Series 105: sub_delay · axial · 3.0mm · 1.12mm/px · z∈[-129,+69]mm · 3 of 67 slices shown]
[im 1/67]
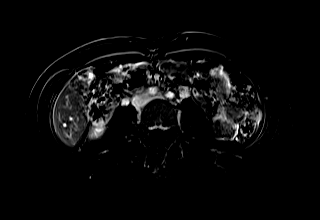
[im 34/67]
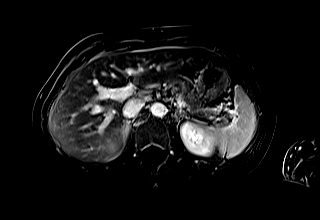
[im 67/67]
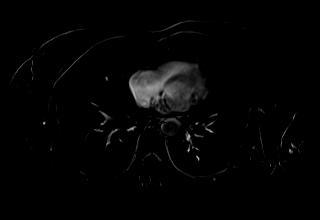

[44 of 48 positions shown; findings below may reference images not displayed]

FINDINGS: Despite efforts by the technologist and patient, motion artifact is
present on today's exam and could not be eliminated. This reduces
exam sensitivity and specificity.

Lower chest: Unremarkable

Hepatobiliary: Benign-appearing 0.5 cm fluid signal intensity lesion
along the anterior-superior capsular margin of segment 2 of the
liver on image [DATE], probably a tiny cyst. The gallbladder appears
unremarkable. No biliary dilatation.

Pancreas: In the uncinate process of the pancreas, a 6 by 4 by 3 mm
(volume = 40 mm^3) T2 hyperintense lesion does not appear to
enhance, compatible with a small cystic lesion. This is essentially
unchanged in size from that reported on [DATE] I not have the
prior images available for comparison, but by report the lesion was
not changed from a prior exam dated [DATE]. Accordingly, we have
established about 3.5 years of stability this tiny lesion.

The pancreas appears otherwise unremarkable.

Spleen:  Unremarkable

Adrenals/Urinary Tract:  Unremarkable

Stomach/Bowel: Unremarkable

Vascular/Lymphatic: Aortoiliac atherosclerotic vascular disease. No
pathologic adenopathy.

Other:  No supplemental non-categorized findings.

Musculoskeletal: Mild disc desiccation at L5-S1. Degenerative
endplate findings at L2-3 and L3-4.
IMPRESSION: 1. 6 by 4 by 3 mm T2 hyperintense lesion in the uncinate process of
the pancreas, without appreciable enhancement. This is essentially
unchanged in size from that reported on [DATE], and by report
the lesion was not changed from a prior exam dated [DATE].
Accordingly, we have established over 3 years of stability.
Possibilities include indolent intraductal papillary mucinous
neoplasm or a small postinflammatory cystic lesion. Based on current
guidelines, in patients under 65 years old, 5 years of stability is
typically considered indicative of benign etiology. Based on these
guidelines, follow up pancreatic protocol MRI with and without
contrast is recommended in 1 years time. This recommendation follows
ACR consensus guidelines: Management of Incidental Pancreatic Cysts:
A White Paper of the ACR Incidental Findings Committee. [HOSPITAL] [3G];[DATE].
2. Degenerative endplate findings at L2-3 and L3-4. This reduces
exam sensitivity and specificity.
3.  Aortic Atherosclerosis ([3G]-[3G]).
4. Despite efforts by the technologist and patient, motion artifact
is present on today's exam and could not be eliminated. This reduces
exam sensitivity and specificity.

## 2019-12-03 IMAGING — MR MR ABDOMEN WO/W CM MRCP
19 of 22 series · 44 of 48 positions shown · IV contrast (gadavist)
Comparison: Report from MRI abdomen from [facility][REDACTED] dated
[DATE]

CLINICAL DATA: Follow up of a 0.6 by 0.4 cm cystic lesion in the
uncinate process of the pancreas seen on outside MRI abdomen from
[DATE]



[Series 5: T2 fat-sat · axial · 6.0mm · 1.14mm/px · 1 of 40 slices shown]
[im 1/40]
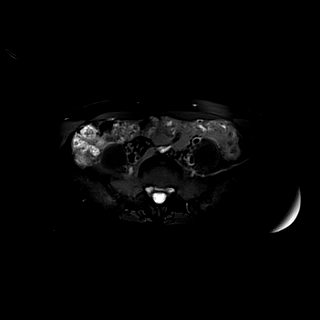

[Series 6: T2 · coronal · 6.0mm · 1.48mm/px · 1 of 30 slices shown (1 of 2)]
[im 1/30]
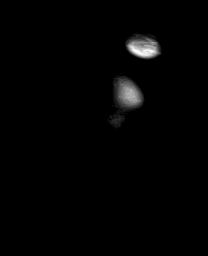

[Series 7: DWI · axial · 6.0mm · 1.36mm/px · z∈[-188,+107]mm · 3 of 84 slices shown (1 of 2)]
[im 1/84]
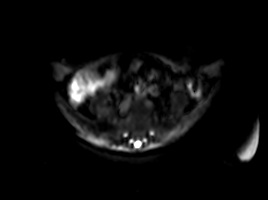
[im 42/84]
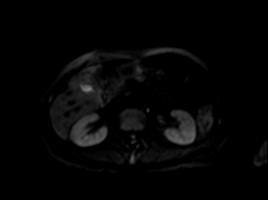
[im 84/84]
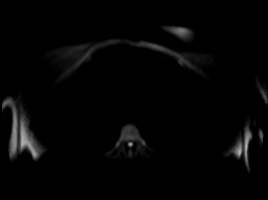

[Series 8: DWI · axial · 6.0mm · 1.36mm/px · 1 of 42 slices shown (2 of 2)]
[im 1/42]
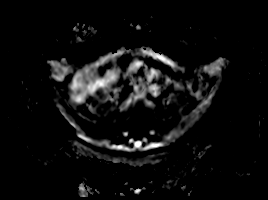

[Series 9: T1 · axial · 3.0mm · 1.12mm/px · z∈[-168,+93]mm · 3 of 88 slices shown (1 of 2)]
[im 1/88]
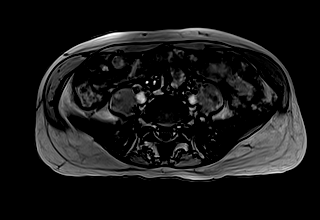
[im 44/88]
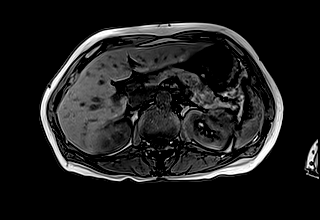
[im 88/88]
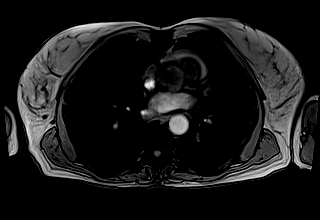

[Series 10: T1 · axial · 3.0mm · 1.12mm/px · z∈[-168,+93]mm · 3 of 88 slices shown (2 of 2)]
[im 1/88]
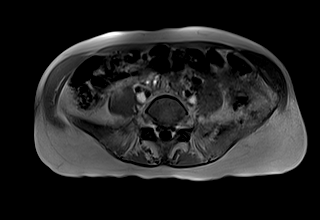
[im 44/88]
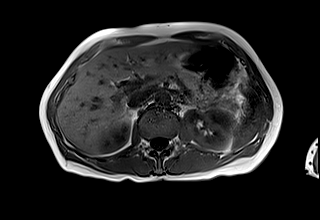
[im 88/88]
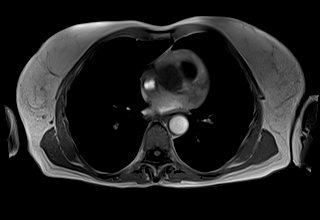

[Series 12: cor obl thk · sagittal · 50.0mm · 0.78mm/px · 1 of 9 slices shown]
[im 1/9]
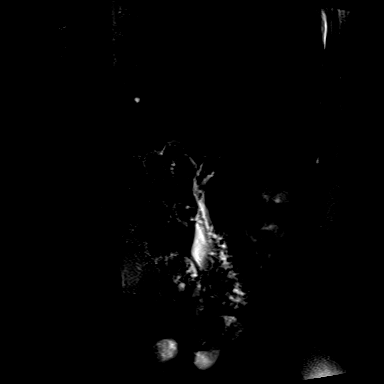

[Series 14: cor_3d_spc_trig · coronal · 1.0mm · 0.49mm/px · 2 of 72 slices shown]
[im 1/72]
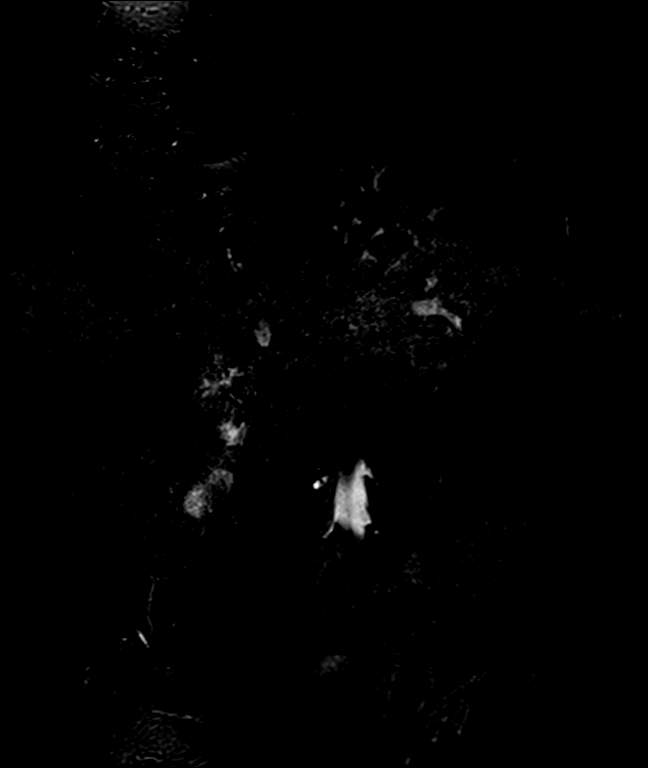
[im 72/72]
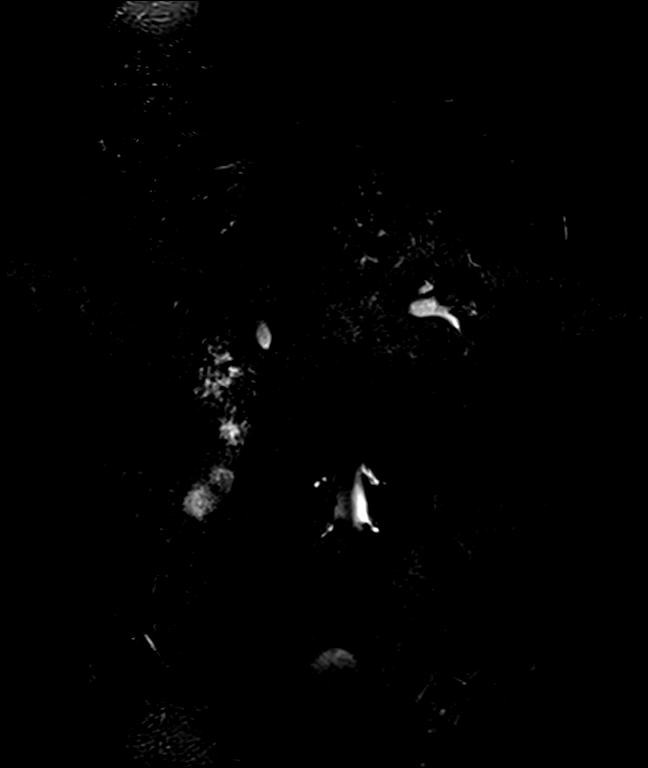

[Series 16: T2 · axial · 6.0mm · 1.41mm/px · 1 of 40 slices shown (2 of 2)]
[im 1/40]
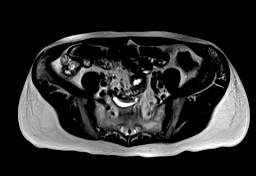

[Series 19: T1 dynamic · axial · 3.0mm · 1.12mm/px · z∈[-192,+69]mm · 3 of 88 slices shown (1 of 6)]
[im 1/88]
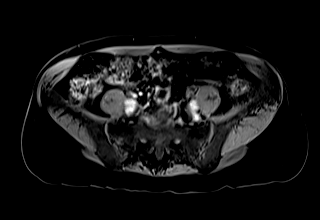
[im 44/88]
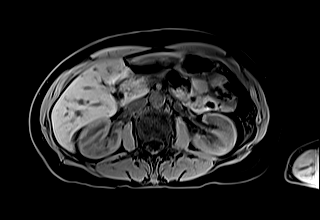
[im 88/88]
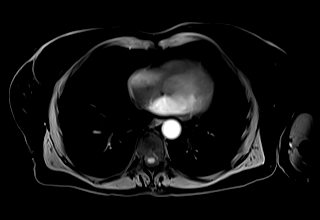

[Series 22: T1 dynamic · axial · 3.0mm · 1.12mm/px · z∈[-192,+69]mm · 3 of 88 slices shown (2 of 6)]
[im 1/88]
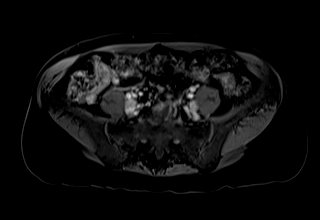
[im 44/88]
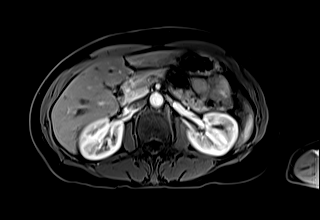
[im 88/88]
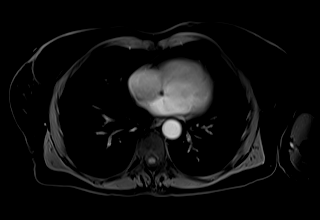

[Series 23: T1 dynamic · axial · 3.0mm · 1.12mm/px · z∈[-192,+69]mm · 3 of 88 slices shown (3 of 6)]
[im 1/88]
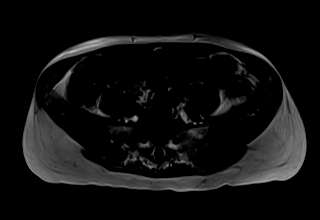
[im 44/88]
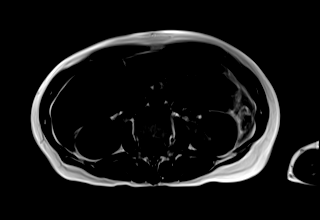
[im 88/88]
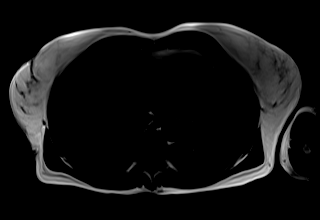

[Series 26: T1 dynamic · axial · 3.0mm · 1.12mm/px · z∈[-192,+69]mm · 3 of 88 slices shown (4 of 6)]
[im 1/88]
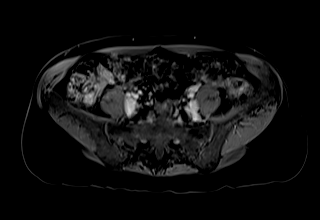
[im 44/88]
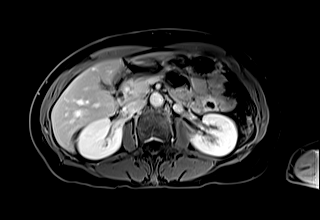
[im 88/88]
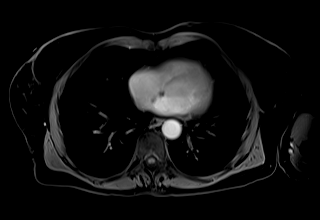

[Series 28: T1 dynamic · coronal · 5.0mm · 1.12mm/px · 1 of 40 slices shown (5 of 6)]
[im 1/40]
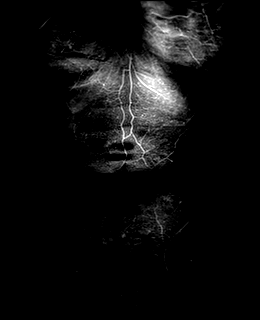

[Series 30: T1 dynamic · axial · 3.0mm · 1.12mm/px · z∈[-192,+69]mm · 3 of 88 slices shown (6 of 6)]
[im 1/88]
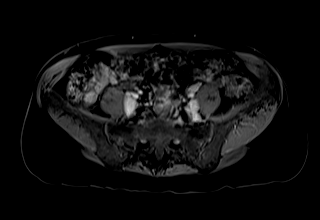
[im 44/88]
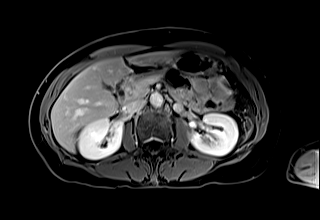
[im 88/88]
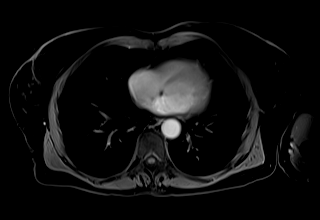

[Series 102: sub_20 sec · axial · 3.0mm · 1.12mm/px · z∈[-192,+69]mm · 3 of 88 slices shown]
[im 1/88]
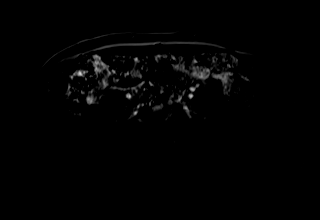
[im 44/88]
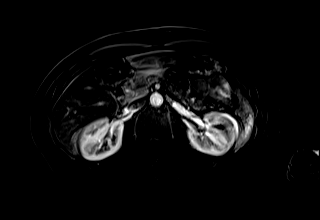
[im 88/88]
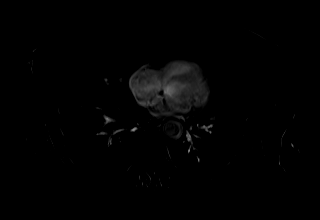

[Series 103: sub_45 sec · axial · 3.0mm · 1.12mm/px · z∈[-192,+69]mm · 3 of 88 slices shown]
[im 1/88]
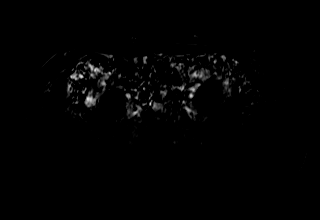
[im 44/88]
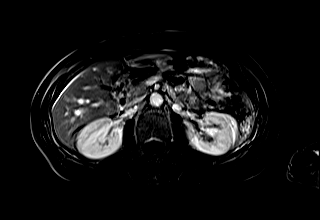
[im 88/88]
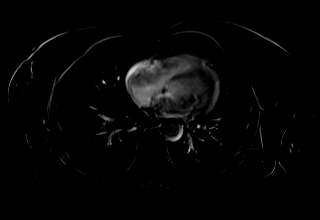

[Series 104: sub_90 sec · axial · 3.0mm · 1.12mm/px · z∈[-192,+69]mm · 3 of 88 slices shown]
[im 1/88]
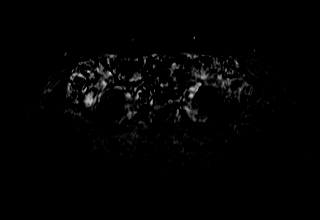
[im 44/88]
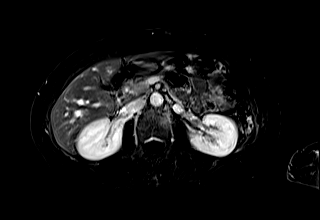
[im 88/88]
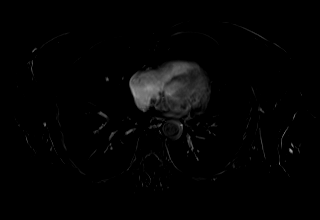

[Series 105: sub_delay · axial · 3.0mm · 1.12mm/px · z∈[-192,+69]mm · 3 of 88 slices shown]
[im 1/88]
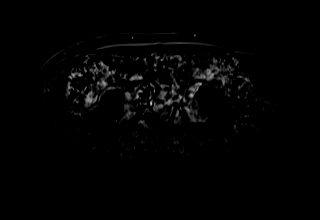
[im 44/88]
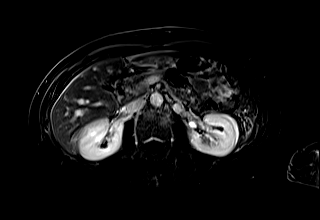
[im 88/88]
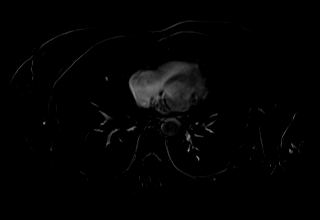

[44 of 48 positions shown; findings below may reference images not displayed]

FINDINGS: Despite efforts by the technologist and patient, motion artifact is
present on today's exam and could not be eliminated. This reduces
exam sensitivity and specificity.

Lower chest: Unremarkable

Hepatobiliary: Benign-appearing 0.5 cm fluid signal intensity lesion
along the anterior-superior capsular margin of segment 2 of the
liver on image [DATE], probably a tiny cyst. The gallbladder appears
unremarkable. No biliary dilatation.

Pancreas: In the uncinate process of the pancreas, a 6 by 4 by 3 mm
(volume = 40 mm^3) T2 hyperintense lesion does not appear to
enhance, compatible with a small cystic lesion. This is essentially
unchanged in size from that reported on [DATE] I not have the
prior images available for comparison, but by report the lesion was
not changed from a prior exam dated [DATE]. Accordingly, we have
established about 3.5 years of stability this tiny lesion.

The pancreas appears otherwise unremarkable.

Spleen:  Unremarkable

Adrenals/Urinary Tract:  Unremarkable

Stomach/Bowel: Unremarkable

Vascular/Lymphatic: Aortoiliac atherosclerotic vascular disease. No
pathologic adenopathy.

Other:  No supplemental non-categorized findings.

Musculoskeletal: Mild disc desiccation at L5-S1. Degenerative
endplate findings at L2-3 and L3-4.
IMPRESSION: 1. 6 by 4 by 3 mm T2 hyperintense lesion in the uncinate process of
the pancreas, without appreciable enhancement. This is essentially
unchanged in size from that reported on [DATE], and by report
the lesion was not changed from a prior exam dated [DATE].
Accordingly, we have established over 3 years of stability.
Possibilities include indolent intraductal papillary mucinous
neoplasm or a small postinflammatory cystic lesion. Based on current
guidelines, in patients under 65 years old, 5 years of stability is
typically considered indicative of benign etiology. Based on these
guidelines, follow up pancreatic protocol MRI with and without
contrast is recommended in 1 years time. This recommendation follows
ACR consensus guidelines: Management of Incidental Pancreatic Cysts:
A White Paper of the ACR Incidental Findings Committee. [HOSPITAL] [3G];[DATE].
2. Degenerative endplate findings at L2-3 and L3-4. This reduces
exam sensitivity and specificity.
3.  Aortic Atherosclerosis ([3G]-[3G]).
4. Despite efforts by the technologist and patient, motion artifact
is present on today's exam and could not be eliminated. This reduces
exam sensitivity and specificity.

## 2019-12-03 MED ORDER — GADOBUTROL 1 MMOL/ML IV SOLN
5.0000 mL | Freq: Once | INTRAVENOUS | Status: AC | PRN
  Administered 2019-12-03: 5 mL via INTRAVENOUS

## 2019-12-09 ENCOUNTER — Other Ambulatory Visit: Payer: Self-pay | Admitting: Obstetrics and Gynecology

## 2019-12-09 DIAGNOSIS — Z9189 Other specified personal risk factors, not elsewhere classified: Secondary | ICD-10-CM

## 2019-12-23 DIAGNOSIS — M1612 Unilateral primary osteoarthritis, left hip: Secondary | ICD-10-CM | POA: Diagnosis not present

## 2019-12-23 DIAGNOSIS — M76891 Other specified enthesopathies of right lower limb, excluding foot: Secondary | ICD-10-CM | POA: Diagnosis not present

## 2019-12-23 DIAGNOSIS — M76892 Other specified enthesopathies of left lower limb, excluding foot: Secondary | ICD-10-CM | POA: Diagnosis not present

## 2019-12-23 DIAGNOSIS — M1611 Unilateral primary osteoarthritis, right hip: Secondary | ICD-10-CM | POA: Diagnosis not present

## 2019-12-23 DIAGNOSIS — M47816 Spondylosis without myelopathy or radiculopathy, lumbar region: Secondary | ICD-10-CM | POA: Diagnosis not present

## 2020-01-06 ENCOUNTER — Other Ambulatory Visit: Payer: Self-pay

## 2020-01-06 ENCOUNTER — Ambulatory Visit
Admission: RE | Admit: 2020-01-06 | Discharge: 2020-01-06 | Disposition: A | Discharge status: Home / Self Care | Payer: BC Managed Care – PPO | Source: Ambulatory Visit | Attending: Obstetrics and Gynecology | Admitting: Obstetrics and Gynecology

## 2020-01-06 DIAGNOSIS — Z9189 Other specified personal risk factors, not elsewhere classified: Secondary | ICD-10-CM

## 2020-01-06 DIAGNOSIS — N6489 Other specified disorders of breast: Secondary | ICD-10-CM | POA: Diagnosis not present

## 2020-01-06 IMAGING — MR MR BREAST BILAT WO/W CM
8 of 12 series · 34 of 48 positions shown · IV contrast (5 GADAVIST)
Comparison: Previous exam(s).

CLINICAL DATA: Patient with elevated lifetime risk of breast
cancer. High risk screening.

EXAM:
BILATERAL BREAST MRI WITH AND WITHOUT CONTRAST
TECHNIQUE: Multiplanar, multisequence MR images of both breasts were obtained
prior to and following the intravenous administration of ml of
Gadavist

[Series 2: t2_tirm_tra ipat (a-p) · axial · 3.0mm · 0.64mm/px · 1 of 59 slices shown]
[im 1/59]
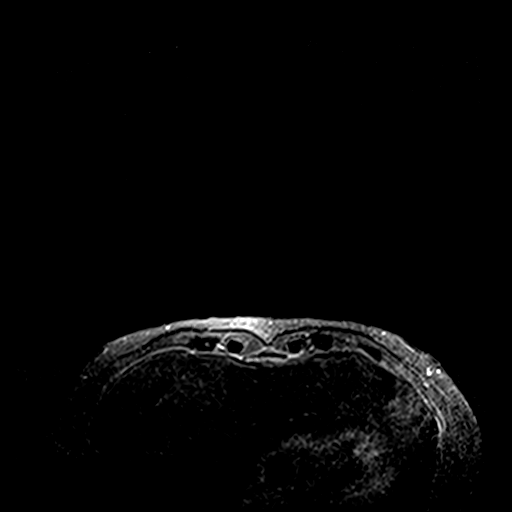

[Series 3: fl3d pre-cm no · axial · non-contrast · 1.2mm · 0.86mm/px · z∈[-98,+74]mm · 5 of 144 slices shown]
[im 1/144]
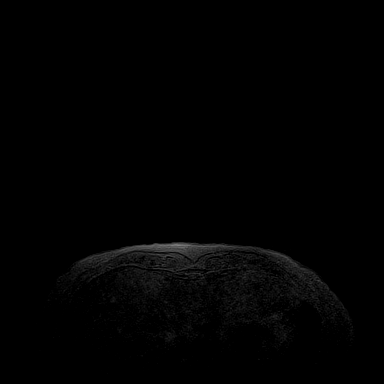
[im 36/144]
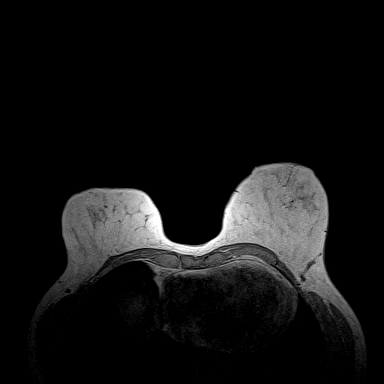
[im 72/144]
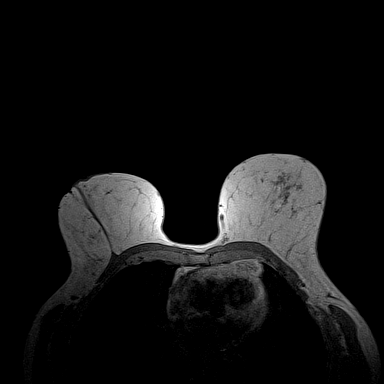
[im 108/144]
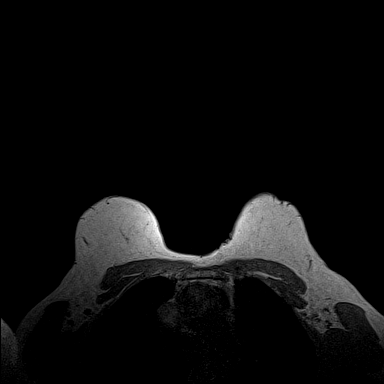
[im 144/144]
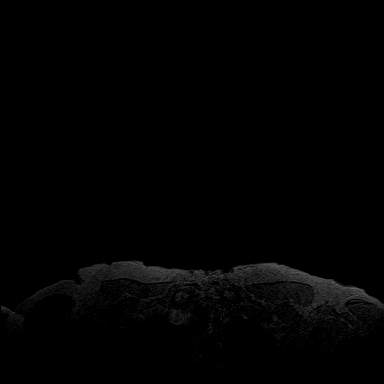

[Series 4: fl3d pre-cm · axial · non-contrast · 1.2mm · 0.86mm/px · z∈[-98,+74]mm · 5 of 144 slices shown]
[im 1/144]
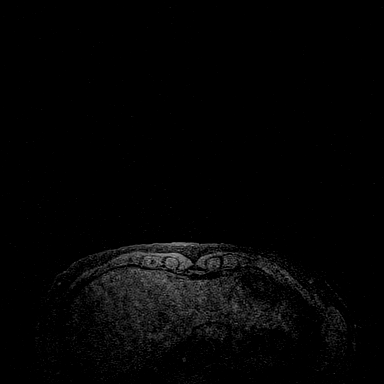
[im 36/144]
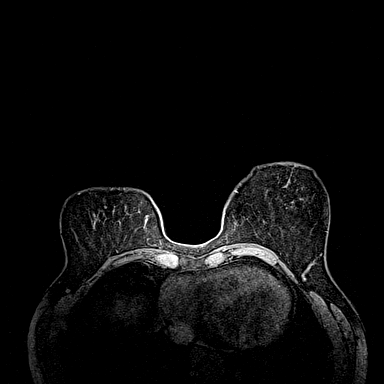
[im 72/144]
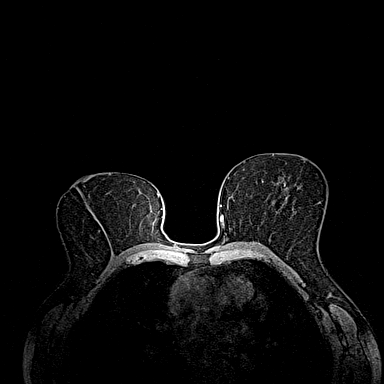
[im 108/144]
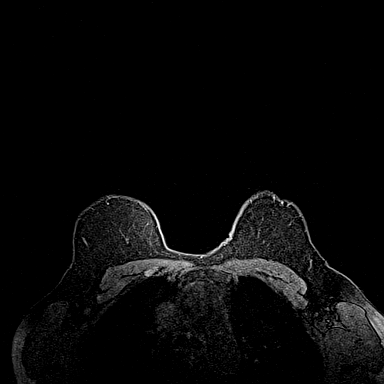
[im 144/144]
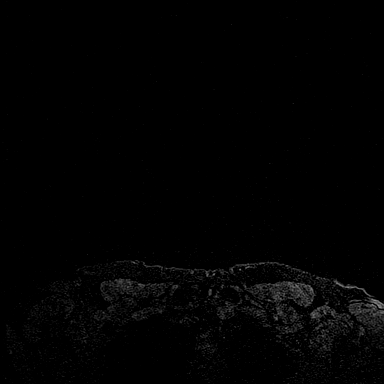

[Series 5: fl3d post-cm 20 · axial · 1.2mm · 0.86mm/px · z∈[-98,+74]mm · 5 of 144 slices shown (1 of 3)]
[im 1/144]
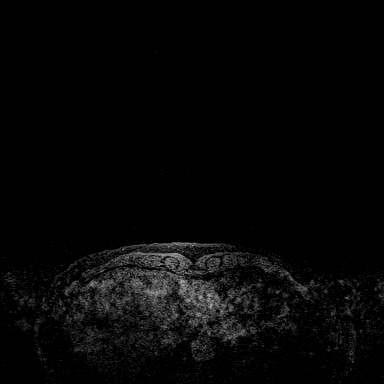
[im 36/144]
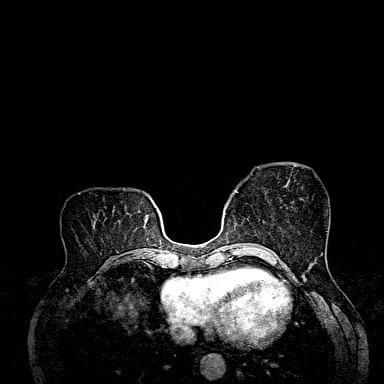
[im 72/144]
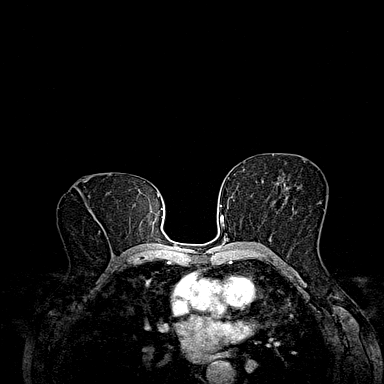
[im 108/144]
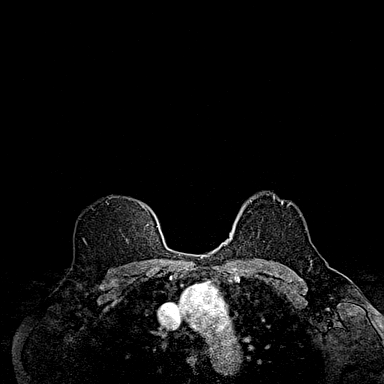
[im 144/144]
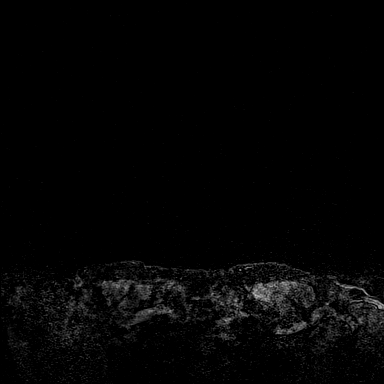

[Series 6: fl3d post-cm 20 · axial · 1.2mm · 0.86mm/px · z∈[-98,+74]mm · 5 of 144 slices shown (2 of 3)]
[im 1/144]
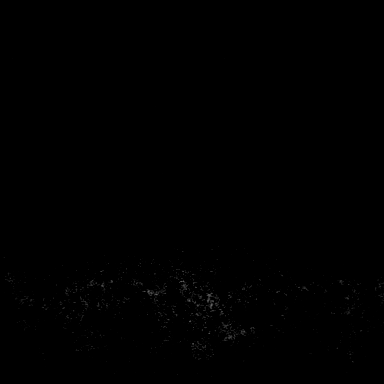
[im 36/144]
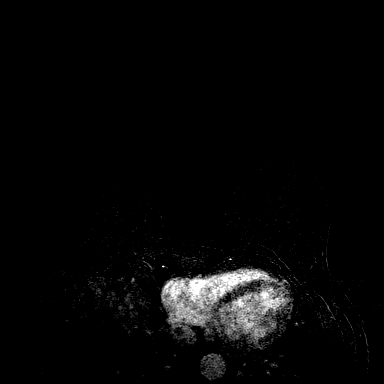
[im 72/144]
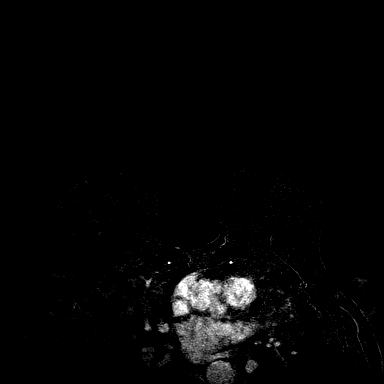
[im 108/144]
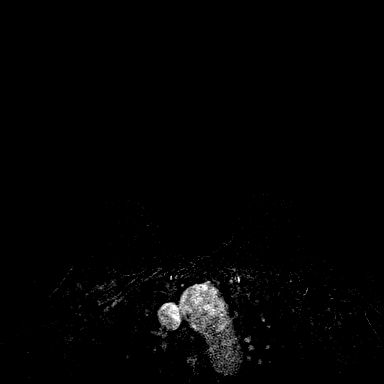
[im 144/144]
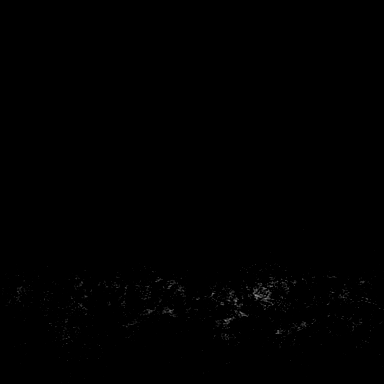

[Series 7: fl3d post-cm 20 · axial · 172.8mm · 0.86mm/px · 1 of 1 slices shown (3 of 3)]
[im 1/1]
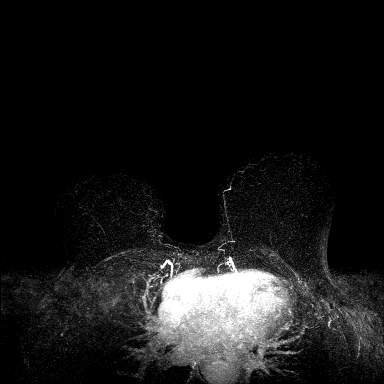

[Series 8: fl3d post-cm 3min · axial · 1.2mm · 0.86mm/px · z∈[-98,+74]mm · 6 of 144 slices shown]
[im 1/144]
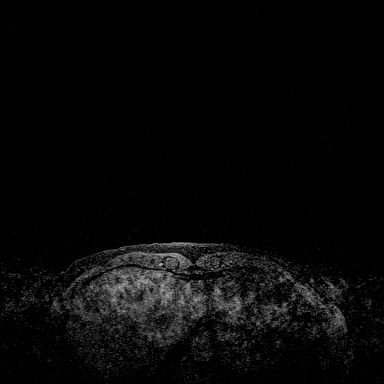
[im 29/144]
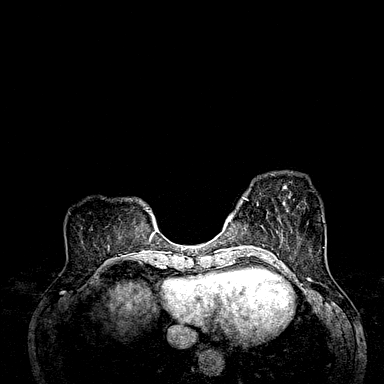
[im 58/144]
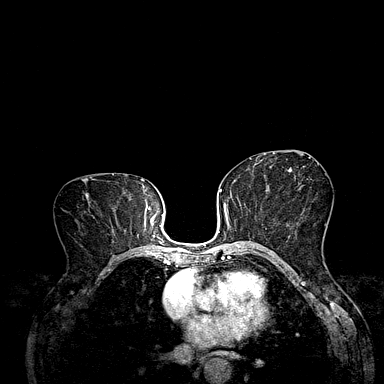
[im 86/144]
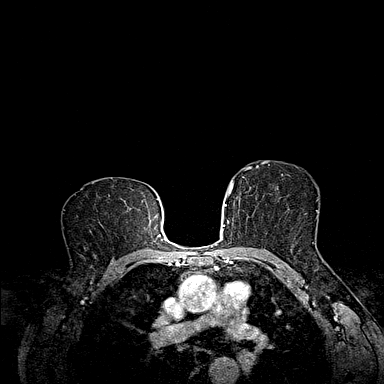
[im 115/144]
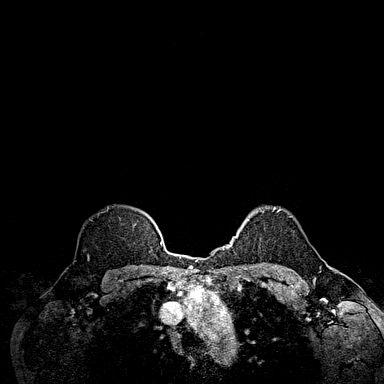
[im 144/144]
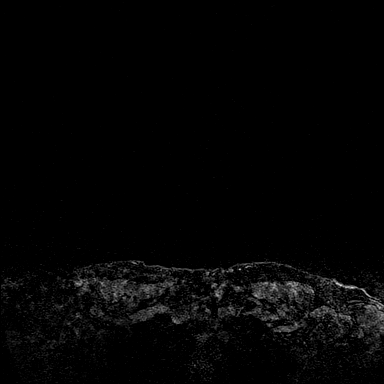

[Series 9: fl3d post-cm 3min_sub · axial · 1.2mm · 0.86mm/px · z∈[-98,+74]mm · 6 of 144 slices shown]
[im 1/144]
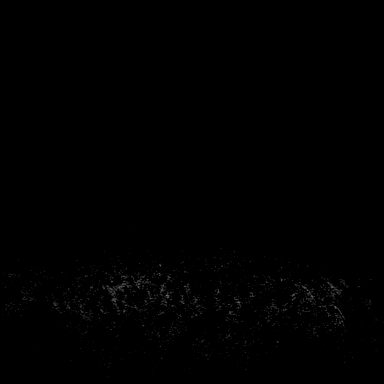
[im 29/144]
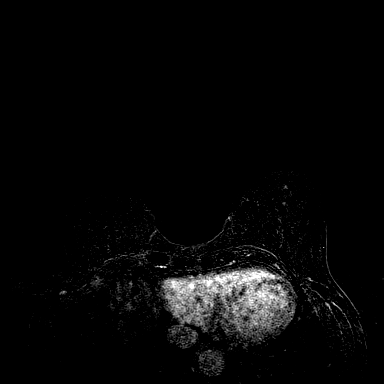
[im 58/144]
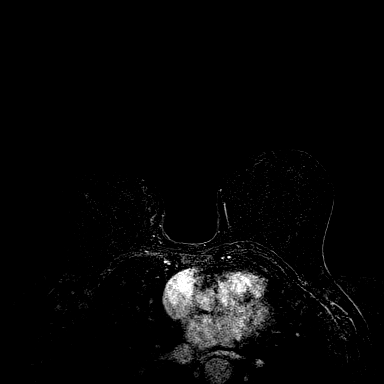
[im 86/144]
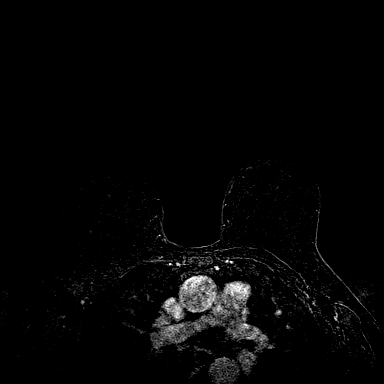
[im 115/144]
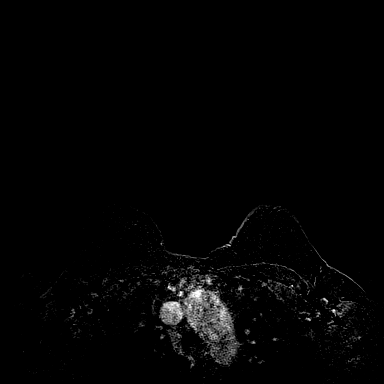
[im 144/144]
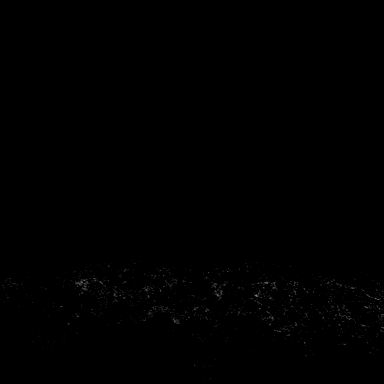

[34 of 48 positions shown; findings below may reference images not displayed]

Three-dimensional MR images were rendered by post-processing of the
original MR data on an independent workstation. The
three-dimensional MR images were interpreted, and findings are
reported in the following complete MRI report for this study. Three
dimensional images were evaluated at the independent interpreting
workstation using the DynaCAD thin client.
FINDINGS: Breast composition: b. Scattered fibroglandular tissue.

Background parenchymal enhancement: Minimal

Right breast: No mass or abnormal enhancement.

Left breast: No mass or abnormal enhancement.

Lymph nodes: No abnormal appearing lymph nodes.

Ancillary findings:  None.
IMPRESSION: No suspicious areas of enhancement within either breast to suggest
malignancy.

RECOMMENDATION:
Annual screening mammography.

Annual high risk screening MRI.

BI-RADS CATEGORY  1: Negative.

## 2020-01-06 MED ORDER — GADOBUTROL 1 MMOL/ML IV SOLN
5.0000 mL | Freq: Once | INTRAVENOUS | Status: AC | PRN
  Administered 2020-01-06: 5 mL via INTRAVENOUS

## 2020-01-28 ENCOUNTER — Encounter: Payer: Self-pay | Admitting: Family Medicine

## 2020-02-03 ENCOUNTER — Ambulatory Visit (INDEPENDENT_AMBULATORY_CARE_PROVIDER_SITE_OTHER): Payer: BC Managed Care – PPO | Admitting: Family Medicine

## 2020-02-03 ENCOUNTER — Encounter: Payer: Self-pay | Admitting: Family Medicine

## 2020-02-03 ENCOUNTER — Other Ambulatory Visit: Payer: Self-pay

## 2020-02-03 VITALS — BP 124/80 | HR 76 | Temp 98.0°F | Ht 65.0 in | Wt 121.4 lb

## 2020-02-03 DIAGNOSIS — K862 Cyst of pancreas: Secondary | ICD-10-CM

## 2020-02-03 DIAGNOSIS — Z0000 Encounter for general adult medical examination without abnormal findings: Secondary | ICD-10-CM

## 2020-02-03 DIAGNOSIS — K529 Noninfective gastroenteritis and colitis, unspecified: Secondary | ICD-10-CM

## 2020-02-03 DIAGNOSIS — Z8719 Personal history of other diseases of the digestive system: Secondary | ICD-10-CM | POA: Diagnosis not present

## 2020-02-03 NOTE — Progress Notes (Signed)
Subjective  Chief Complaint  Patient presents with  . Annual Exam    fasting    HPI: Erika Carlson is a 59 y.o. female who presents to [facility] today for a Female Wellness Visit. She also has the concerns and/or needs as listed above in the chief complaint. These will be addressed in addition to the Health Maintenance Visit.   Wellness Visit: annual visit with health maintenance review and exam without Pap   HM: went to GYN: nl pap and mammo; imms up to date. Feels well. Healthy lifestyle Chronic disease f/u and/or acute problem visit: (deemed necessary to be done in addition to the wellness visit):  Chronic diarrhea and hx of hep c, now eradicated: reviewed GI consult; f/u MRI for pancreatic cyst stable. immodium for physiologic or idiopathic diarrhea controls sxs. No other needs.    Assessment  1. Annual physical exam   2. Chronic diarrhea   3. Pancreatic cyst   4. History of chronic hepatitis      Plan  Female Wellness Visit:  Age appropriate Health Maintenance and Prevention measures were discussed with patient. Included topics are cancer screening recommendations, ways to keep healthy (see AVS) including dietary and exercise recommendations, regular eye and dental care, use of seat belts, and avoidance of moderate alcohol use and tobacco use.   BMI: discussed patient's BMI and encouraged positive lifestyle modifications to help get to or maintain a target BMI.  HM needs and immunizations were addressed and ordered. See below for orders. See HM and immunization section for updates.  Routine labs and screening tests ordered including cmp, cbc and lipids where appropriate.  Discussed recommendations regarding Vit D and calcium supplementation (see AVS)  Chronic disease management visit and/or acute problem visit:  GI: stable.  Follow up: 12 mo for cpe  Orders Placed This Encounter  Procedures  . Basic metabolic panel  . CBC with  Differential/Platelet  . Lipid panel  . TSH   No orders of the defined types were placed in this encounter.     Lifestyle: Body mass index is 20.2 kg/m. Wt Readings from Last 3 Encounters:  02/03/20 121 lb 6.4 oz (55.1 kg)  11/24/19 118 lb 6.4 oz (53.7 kg)  09/18/19 116 lb 3.2 oz (52.7 kg)   D  Patient Active Problem List   Diagnosis Date Noted  . History of chronic hepatitis 02/03/2020    Eradicated with Harvoni   . Chronic midline low back pain without sciatica 09/18/2019  . Chronic diarrhea 09/18/2019    For decades with negative workup controlled with prn immodium. q 5 year screens.    . Osteoarthritis, hip, bilateral 09/02/2018  . Pancreatic cyst 01/29/2018    Formatting of this note might be different from the original. Repeat MRCP- 05/2019   . Atypical ductal hyperplasia of right breast 03/30/2014  . Kidney stone on left side 01/23/2012  . Other bipolar disorder (HCC) 10/13/2010  . Fibrocystic breast disease 08/30/2010    Formatting of this note might be different from the original. Biopsy 2011    Health Maintenance  Topic Date Due  . MAMMOGRAM  11/25/2020  . TETANUS/TDAP  03/05/2022  . COLONOSCOPY  03/04/2023  . PAP SMEAR-Modifier  10/23/2024  . INFLUENZA VACCINE  Completed  . COVID-19 Vaccine  Completed  . Hepatitis C Screening  Completed  . HIV Screening  Discontinued   Immunization History  Administered Date(s) Administered  . Hep A / Hep B 01/29/2018, 03/04/2018  .  Influenza, Seasonal, Injecte, Preservative Fre 02/24/2011  . Influenza,inj,Quad PF,6+ Mos 02/16/2012, 12/17/2012, 12/02/2013, 04/09/2015, 02/19/2017  . Influenza,inj,Quad PF,6-35 Mos 02/16/2012  . Influenza-Unspecified 01/13/2020  . PFIZER SARS-COV-2 Vaccination 06/15/2019, 07/07/2019  . Pneumococcal Polysaccharide-23 03/04/2018  . Tdap 03/05/2012  . Zoster Recombinat (Shingrix) 10/30/2017, 01/29/2018   We updated and reviewed the patient's past history in detail and it is  documented below. Allergies: Patient is allergic to acetaminophen, codeine, lactose, sudafed pe [phenylephrine], and loratadine-pseudoephedrine er. Past Medical History Patient  has a past medical history of Allergy, Arthritis, Colon polyps, Hepatitis C, IBS (irritable bowel syndrome), and Kidney stones. Past Surgical History Patient  has a past surgical history that includes Breast biopsy; Tonsillectomy and adenoidectomy (1971); Wisdom tooth extraction (1977); and Kidney stone surgery (2006). Family History: Patient family history includes Alcohol abuse in her paternal grandfather; Arthritis in her mother and paternal grandmother; Breast cancer in her paternal aunt, paternal aunt, and paternal grandmother; Colon cancer (age of onset: 75) in her paternal uncle; Diabetes in her paternal grandfather; Hearing loss in her father and paternal grandmother; Heart attack in her maternal grandfather; Hyperlipidemia in her father, maternal grandmother, and mother; Hypertension in her father, maternal grandmother, and mother; Pancreatic cancer in her maternal uncle. Social History:  Patient  reports that she quit smoking about 30 years ago. Her smoking use included cigarettes. She has never used smokeless tobacco. She reports current alcohol use. She reports that she does not use drugs.  Review of Systems: Constitutional: negative for fever or malaise Ophthalmic: negative for photophobia, double vision or loss of vision Cardiovascular: negative for chest pain, dyspnea on exertion, or new LE swelling Respiratory: negative for SOB or persistent cough Gastrointestinal: negative for abdominal pain, change in bowel habits or melena Genitourinary: negative for dysuria or gross hematuria, no abnormal uterine bleeding or disharge Musculoskeletal: negative for new gait disturbance or muscular weakness Integumentary: negative for new or persistent rashes, no breast lumps Neurological: negative for TIA or stroke  symptoms Psychiatric: negative for SI or delusions Allergic/Immunologic: negative for hives  Patient Care Team    Relationship Specialty Notifications Start End  Willow Ora, MD PCP - General Family Medicine  09/18/19   Rachael Fee, MD Attending Physician Gastroenterology  02/03/20   Ranae Pila, MD Consulting Physician Obstetrics and Gynecology  02/03/20     Objective  Vitals: BP 124/80   Pulse 76   Temp 98 F (36.7 C) (Temporal)   Ht 5\' 5"  (1.651 m)   Wt 121 lb 6.4 oz (55.1 kg)   SpO2 96%   BMI 20.20 kg/m  General:  Well developed, well nourished, no acute distress  Psych:  Alert and orientedx3,normal mood and affect HEENT:  Normocephalic, atraumatic, non-icteric sclera,  supple neck without adenopathy, mass or thyromegaly Cardiovascular:  Normal S1, S2, RRR without gallop, rub or murmur Respiratory:  Good breath sounds bilaterally, CTAB with normal respiratory effort Gastrointestinal: normal bowel sounds, soft, non-tender, no noted masses. No HSM MSK: no deformities, contusions. Joints are without erythema or swelling.  Skin:  Warm, no rashes or suspicious lesions noted Neurologic:    Mental status is normal.. No tremor    Commons side effects, risks, benefits, and alternatives for medications and treatment plan prescribed today were discussed, and the patient expressed understanding of the given instructions. Patient is instructed to call or message via MyChart if he/she has any questions or concerns regarding our treatment plan. No barriers to understanding were identified. We discussed Red Flag symptoms  and signs in detail. Patient expressed understanding regarding what to do in case of urgent or emergency type symptoms.   Medication list was reconciled, printed and provided to the patient in AVS. Patient instructions and summary information was reviewed with the patient as documented in the AVS. This note was prepared with assistance of Dragon voice  recognition software. Occasional wrong-word or sound-a-like substitutions may have occurred due to the inherent limitations of voice recognition software  This visit occurred during the SARS-CoV-2 public health emergency.  Safety protocols were in place, including screening questions prior to the visit, additional usage of staff PPE, and extensive cleaning of exam room while observing appropriate contact time as indicated for disinfecting solutions.

## 2020-02-03 NOTE — Patient Instructions (Signed)
Please return in 12 months for your annual complete physical; please come fasting.  I will release your lab results to you on your MyChart account with further instructions. Please reply with any questions.   If you have any questions or concerns, please don't hesitate to send me a message via MyChart or call the office at [phone]. Thank you for visiting with us today! It's our pleasure caring for you.  Please do these things to maintain good health!   Exercise at least 30-45 minutes a day,  4-5 days a week.   Eat a low-fat diet with lots of fruits and vegetables, up to 7-9 servings per day.  Drink plenty of water daily. Try to drink 8 8oz glasses per day.  Seatbelts can save your life. Always wear your seatbelt.  Place Smoke Detectors on every level of your home and check batteries every year.  Schedule an appointment with an eye doctor for an eye exam every 1-2 years  Safe sex - use condoms to protect yourself from STDs if you could be exposed to these types of infections. Use birth control if you do not want to become pregnant and are sexually active.  Avoid heavy alcohol use. If you drink, keep it to less than 2 drinks/day and not every day.  Health Care Power of Attorney.  Choose someone you trust that could speak for you if you became unable to speak for yourself.  Depression is common in our stressful world.If you're feeling down or losing interest in things you normally enjoy, please come in for a visit.  If anyone is threatening or hurting you, please get help. Physical or Emotional Violence is never OK.   

## 2020-02-04 LAB — CBC WITH DIFFERENTIAL/PLATELET
Absolute Monocytes: 378 cells/uL (ref 200–950)
Basophils Absolute: 89 cells/uL (ref 0–200)
Basophils Relative: 1.5 %
Eosinophils Absolute: 47 cells/uL (ref 15–500)
Eosinophils Relative: 0.8 %
HCT: 42.8 % (ref 35.0–45.0)
Hemoglobin: 14 g/dL (ref 11.7–15.5)
Lymphs Abs: 1929 cells/uL (ref 850–3900)
MCH: 29.9 pg (ref 27.0–33.0)
MCHC: 32.7 g/dL (ref 32.0–36.0)
MCV: 91.5 fL (ref 80.0–100.0)
MPV: 10.4 fL (ref 7.5–12.5)
Monocytes Relative: 6.4 %
Neutro Abs: 3457 cells/uL (ref 1500–7800)
Neutrophils Relative %: 58.6 %
Platelets: 228 10*3/uL (ref 140–400)
RBC: 4.68 10*6/uL (ref 3.80–5.10)
RDW: 11.9 % (ref 11.0–15.0)
Total Lymphocyte: 32.7 %
WBC: 5.9 10*3/uL (ref 3.8–10.8)

## 2020-02-04 LAB — TSH: TSH: 2.49 mIU/L (ref 0.40–4.50)

## 2020-02-04 LAB — LIPID PANEL
Cholesterol: 218 mg/dL — ABNORMAL HIGH (ref <200)
HDL: 103 mg/dL (ref >=50)
LDL Cholesterol (Calc): 97 mg/dL (calc)
Non-HDL Cholesterol (Calc): 115 mg/dL (calc) (ref <130)
Total CHOL/HDL Ratio: 2.1 (calc) (ref <5.0)
Triglycerides: 89 mg/dL (ref <150)

## 2020-02-04 LAB — BASIC METABOLIC PANEL
BUN: 17 mg/dL (ref 7–25)
CO2: 29 mmol/L (ref 20–32)
Calcium: 9.6 mg/dL (ref 8.6–10.4)
Chloride: 102 mmol/L (ref 98–110)
Creat: 0.84 mg/dL (ref 0.50–0.99)
Glucose, Bld: 95 mg/dL (ref 65–99)
Potassium: 4.1 mmol/L (ref 3.5–5.3)
Sodium: 139 mmol/L (ref 135–146)

## 2020-03-04 DIAGNOSIS — U071 COVID-19: Secondary | ICD-10-CM | POA: Diagnosis not present

## 2020-12-28 ENCOUNTER — Other Ambulatory Visit: Payer: Self-pay | Admitting: Obstetrics and Gynecology

## 2020-12-28 DIAGNOSIS — R928 Other abnormal and inconclusive findings on diagnostic imaging of breast: Secondary | ICD-10-CM

## 2021-01-24 ENCOUNTER — Ambulatory Visit
Admission: RE | Admit: 2021-01-24 | Discharge: 2021-01-24 | Disposition: A | Discharge status: Home / Self Care | Payer: BC Managed Care – PPO | Source: Ambulatory Visit | Attending: Obstetrics and Gynecology | Admitting: Obstetrics and Gynecology

## 2021-01-24 ENCOUNTER — Other Ambulatory Visit: Payer: Self-pay

## 2021-01-24 ENCOUNTER — Ambulatory Visit
Admission: RE | Admit: 2021-01-24 | Discharge: 2021-01-24 | Disposition: A | Discharge status: Home / Self Care | Payer: 59 | Source: Ambulatory Visit | Attending: Obstetrics and Gynecology | Admitting: Obstetrics and Gynecology

## 2021-01-24 DIAGNOSIS — R928 Other abnormal and inconclusive findings on diagnostic imaging of breast: Secondary | ICD-10-CM

## 2021-01-24 IMAGING — MG MM DIGITAL DIAGNOSTIC UNILAT*L* W/ TOMO W/ CAD
4 series · 4 of 12 positions shown · non-contrast
Comparison: Previous exam(s).

CLINICAL DATA: Possible mass in the upper-outer quadrant of the
left breast on a recent screening mammogram.

EXAM:
DIGITAL DIAGNOSTIC UNILATERAL LEFT MAMMOGRAM WITH TOMOSYNTHESIS AND
CAD; ULTRASOUND LEFT BREAST LIMITED
TECHNIQUE: Left digital diagnostic mammography and breast tomosynthesis was
performed. The images were evaluated with computer-aided detection.;
Targeted ultrasound examination of the left breast was performed.

[L MLO synth-2D]
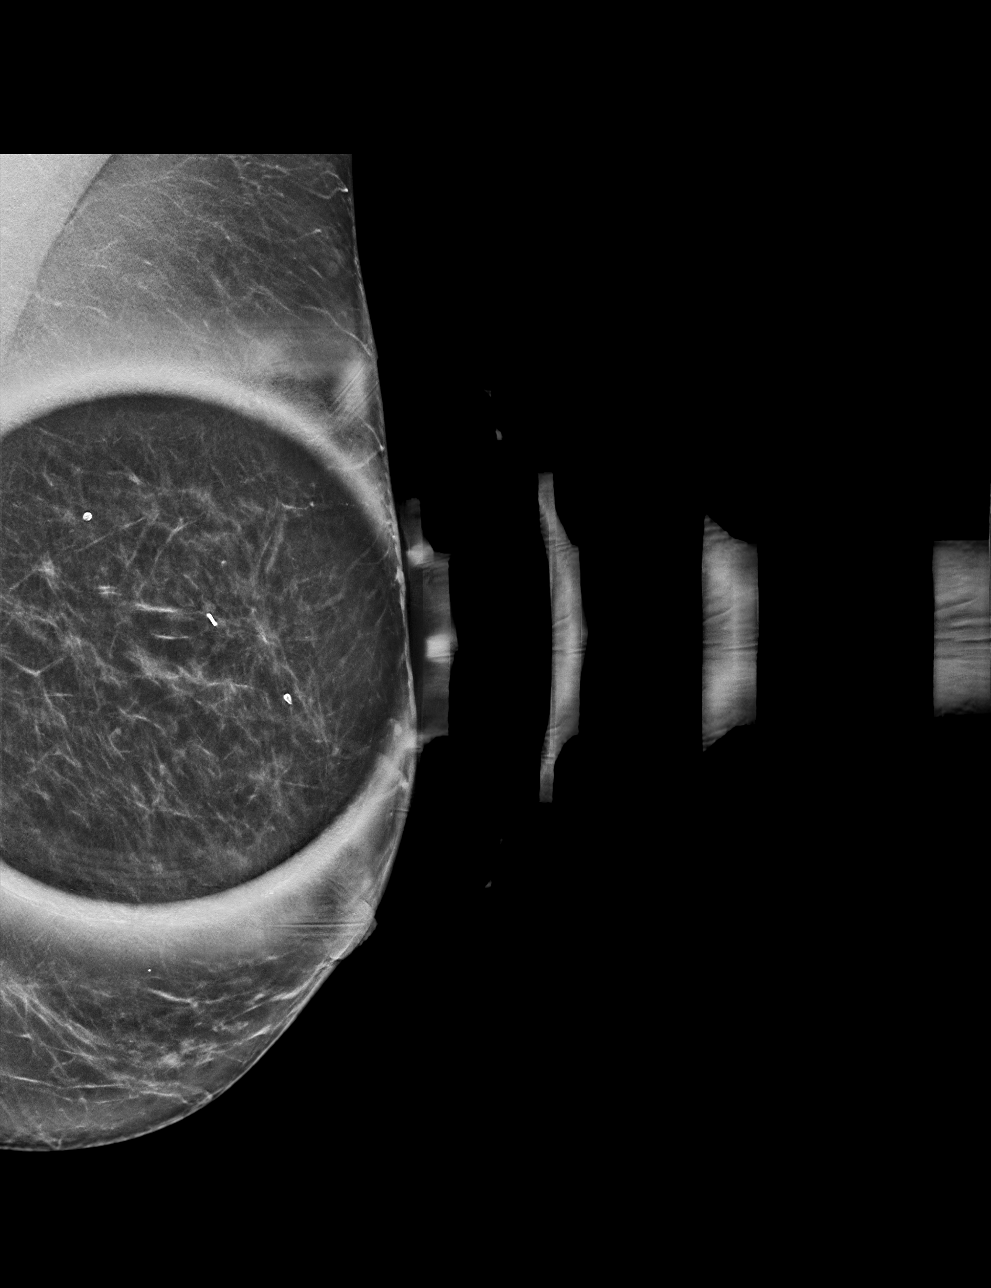

[L CC synth-2D]
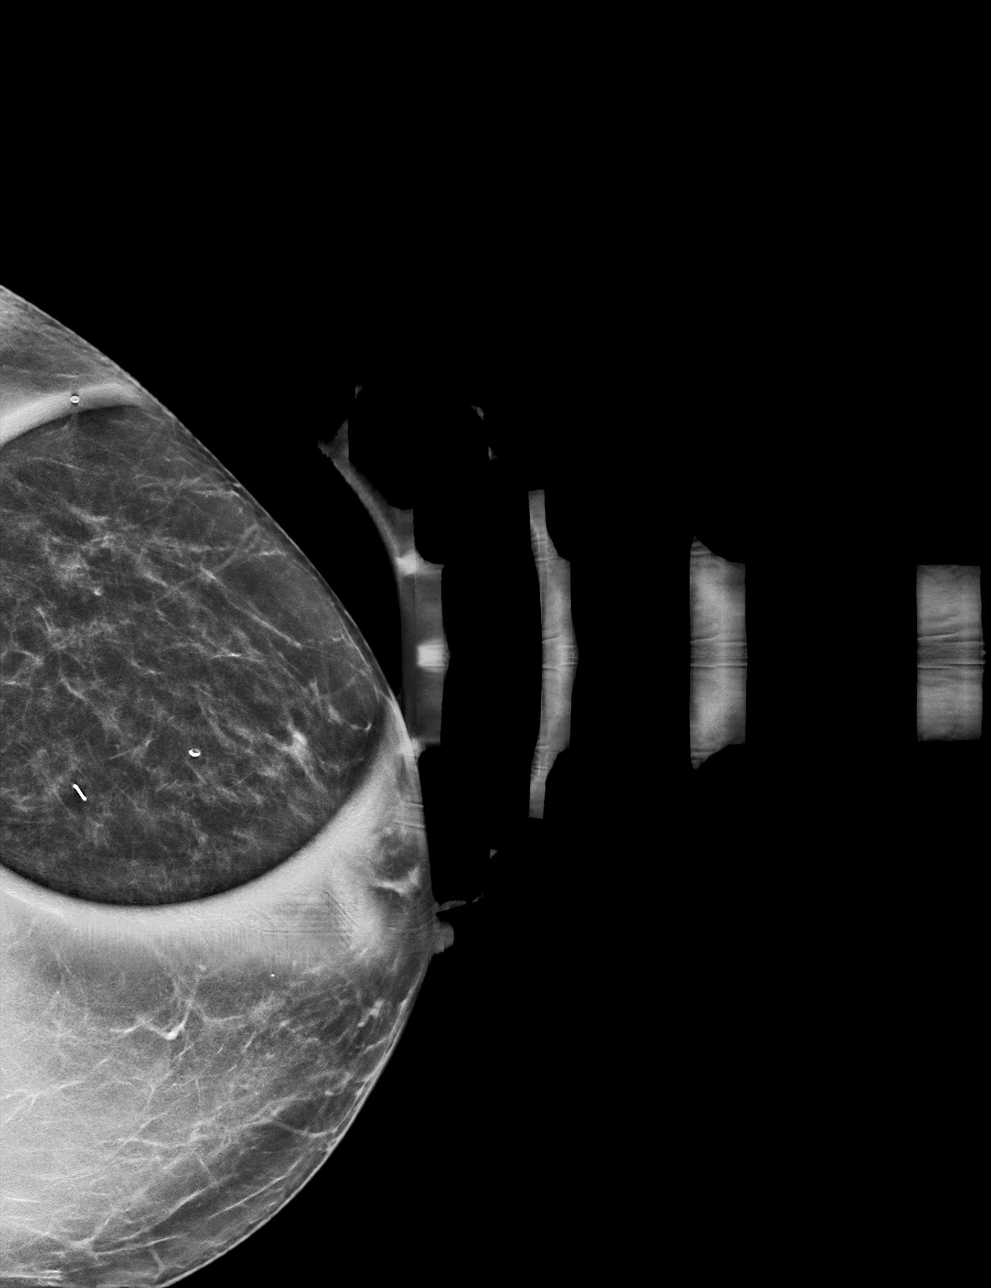

[L CC tomo · tomo slice 28/55.0]
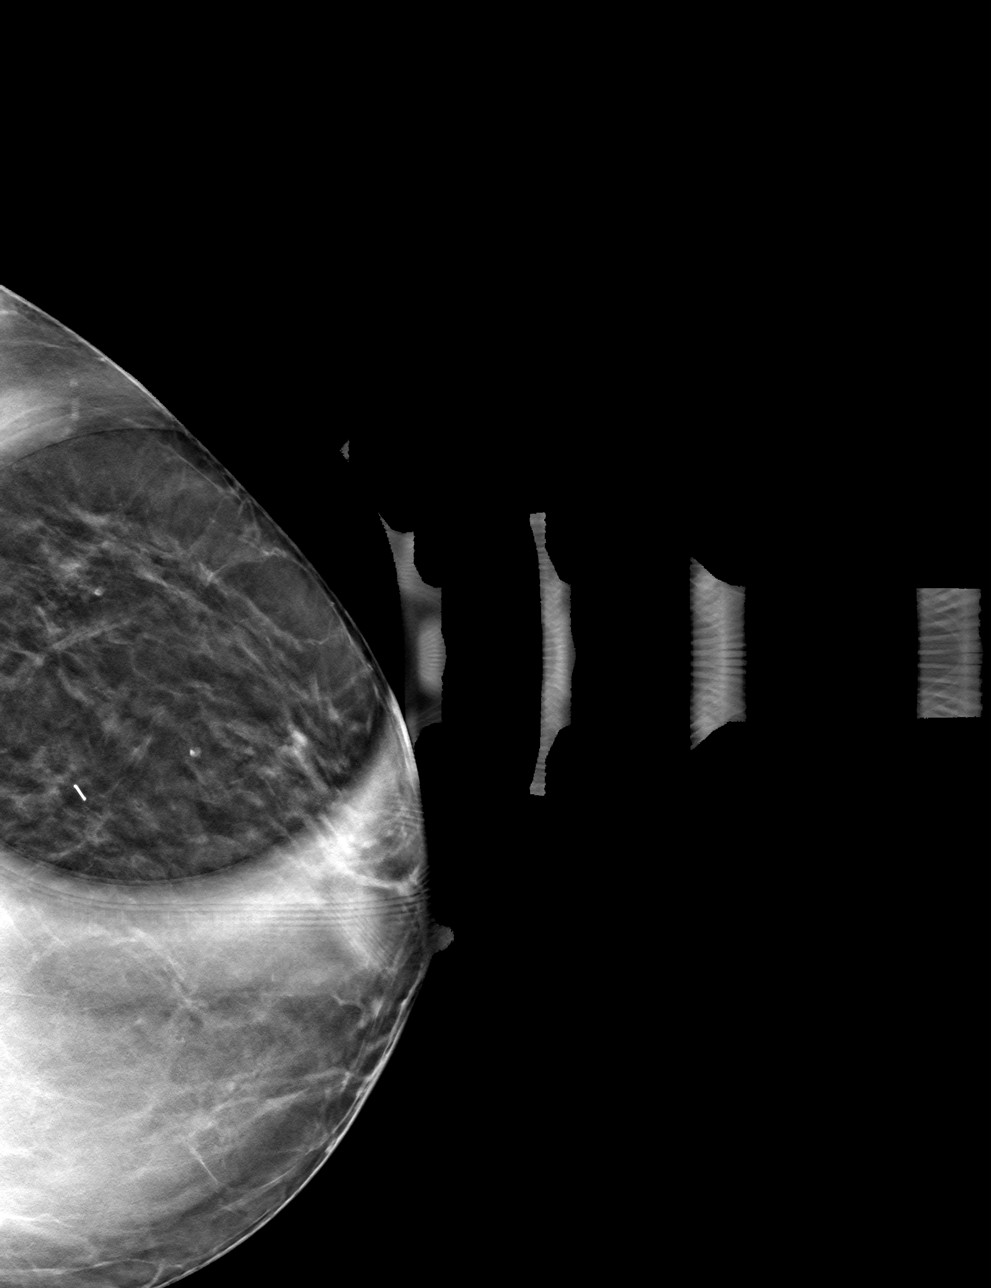

[L MLO tomo · tomo slice 32/63.0]
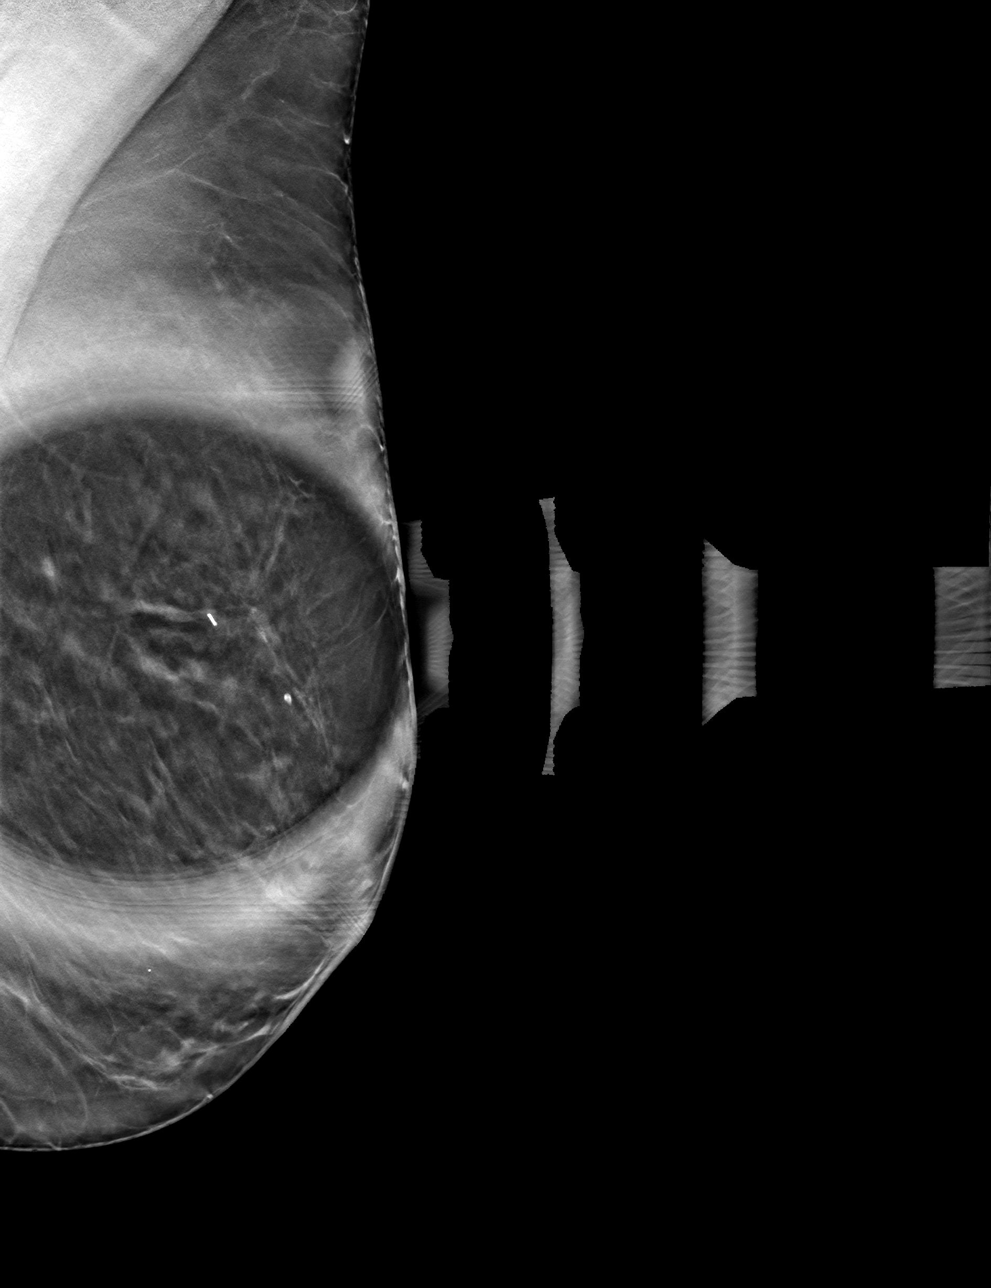

[4 of 12 positions shown; findings below may reference images not displayed]

ACR Breast Density Category b: There are scattered areas of
fibroglandular density.
FINDINGS: 3D tomographic and 2D generated spot compression images of the left
breast demonstrate normal appearing fibroglandular tissue at the
location of the recently suspected 5 mm circumscribed mass in the
upper-outer quadrant of the breast.

Targeted ultrasound is performed, showing a 4 mm cyst with a thin
internal septation and low-level internal echoes in the 2 o'clock
position of the left breast, 6 cm from the nipple. This corresponds
to the mammographic mass.
IMPRESSION: 1. 4 mm benign left breast cyst.
2. No evidence of malignancy.

RECOMMENDATION:
Bilateral screening mammogram in 11 months when due.

I have discussed the findings and recommendations with the patient.
If applicable, a reminder letter will be sent to the patient
regarding the next appointment.

BI-RADS CATEGORY  2: Benign.

## 2021-01-24 IMAGING — US US BREAST*L* LIMITED INC AXILLA
1 series · 8 of 8 positions shown · non-contrast
Comparison: Previous exam(s).

CLINICAL DATA: Possible mass in the upper-outer quadrant of the
left breast on a recent screening mammogram.

EXAM:
DIGITAL DIAGNOSTIC UNILATERAL LEFT MAMMOGRAM WITH TOMOSYNTHESIS AND
CAD; ULTRASOUND LEFT BREAST LIMITED
TECHNIQUE: Left digital diagnostic mammography and breast tomosynthesis was
performed. The images were evaluated with computer-aided detection.;
Targeted ultrasound examination of the left breast was performed.

[Series 1: us breast*left* limited inc axilla · 0.06mm/px · 8 of 8 slices shown]
[im 1/8]
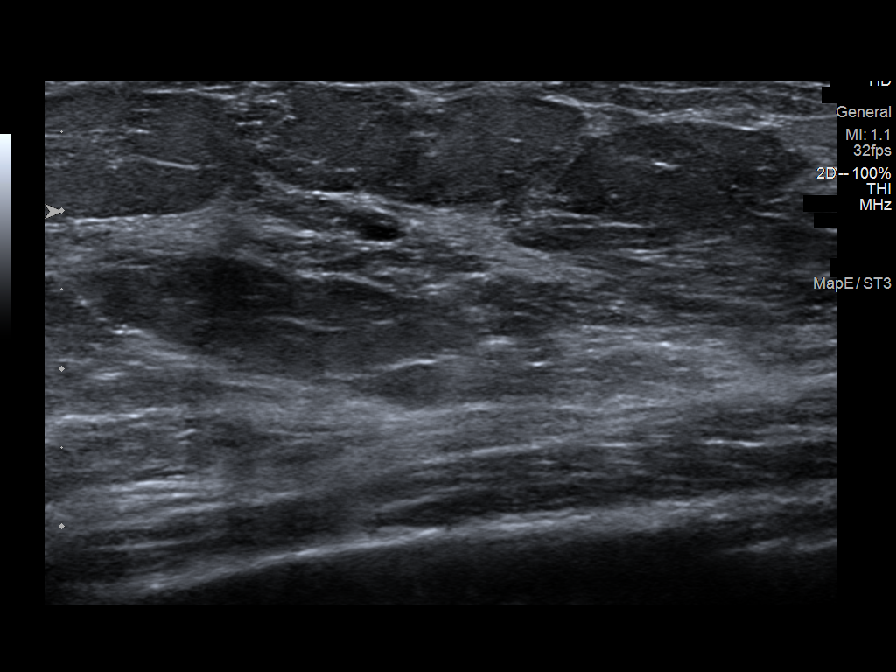
[im 2/8]
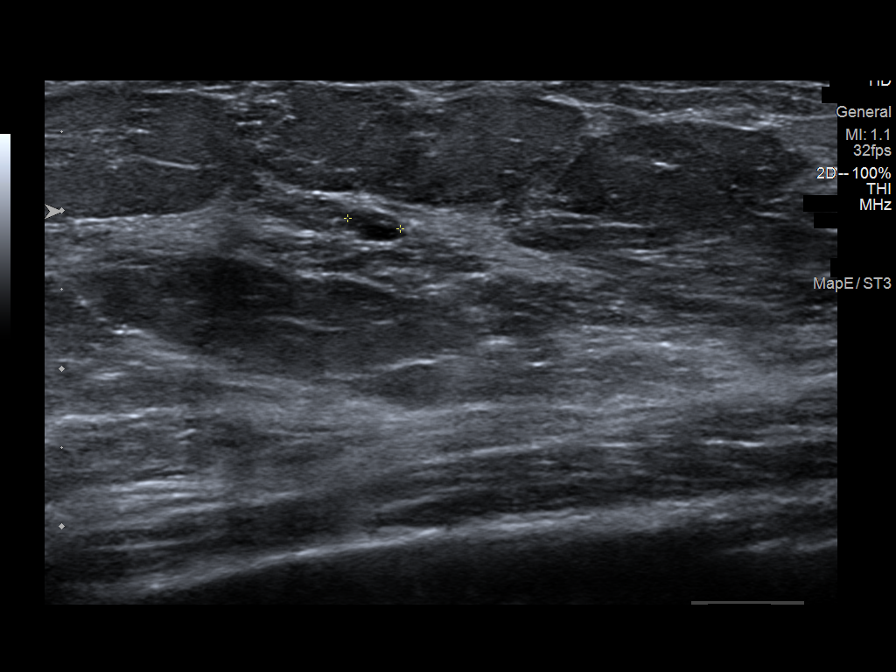
[im 3/8]
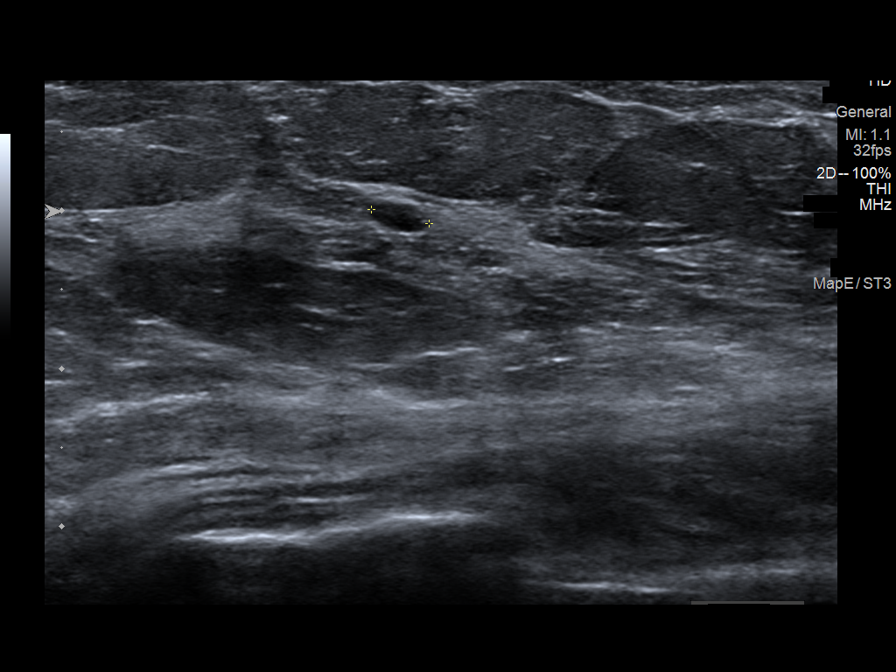
[im 4/8]
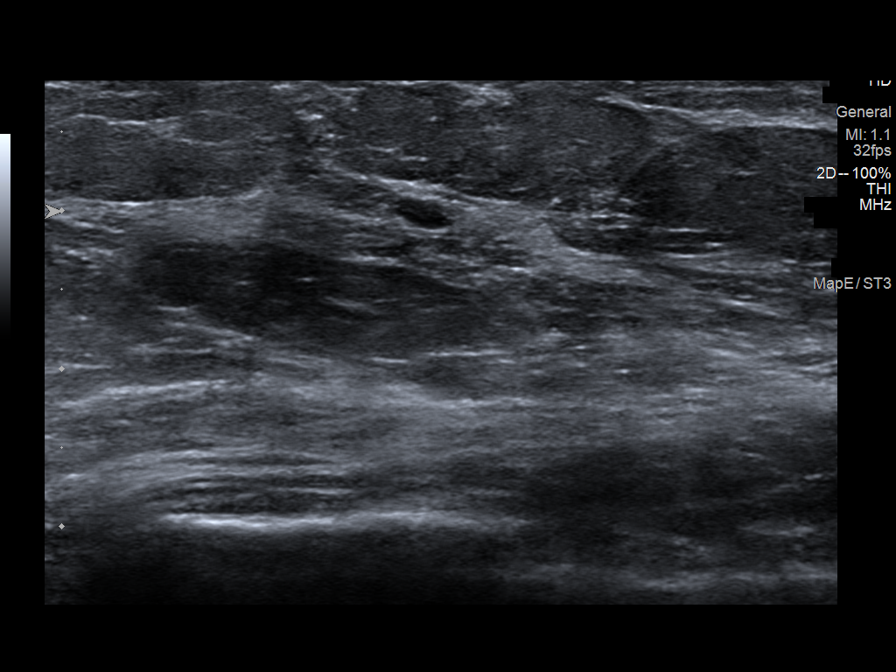
[im 5/8]
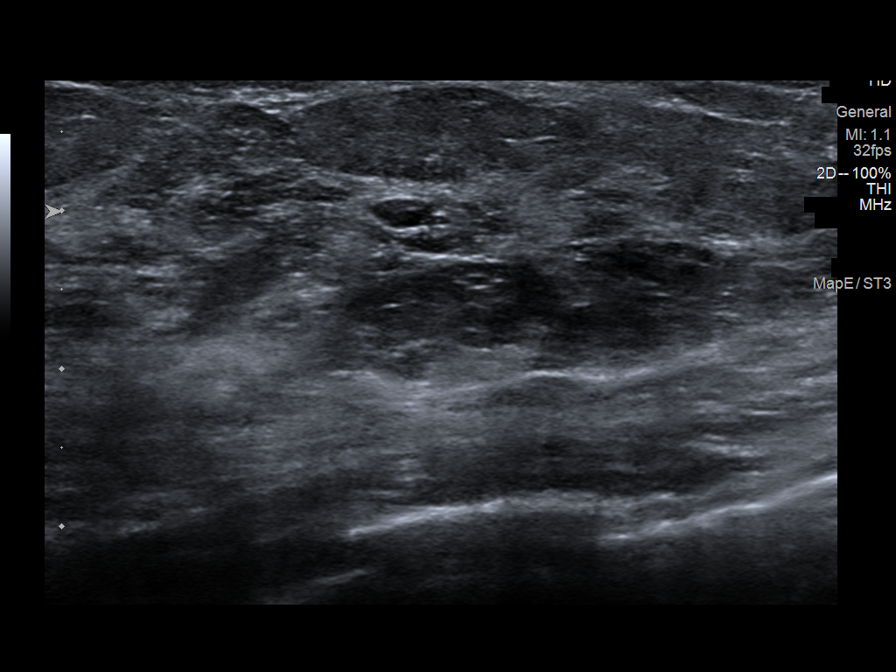
[im 6/8]
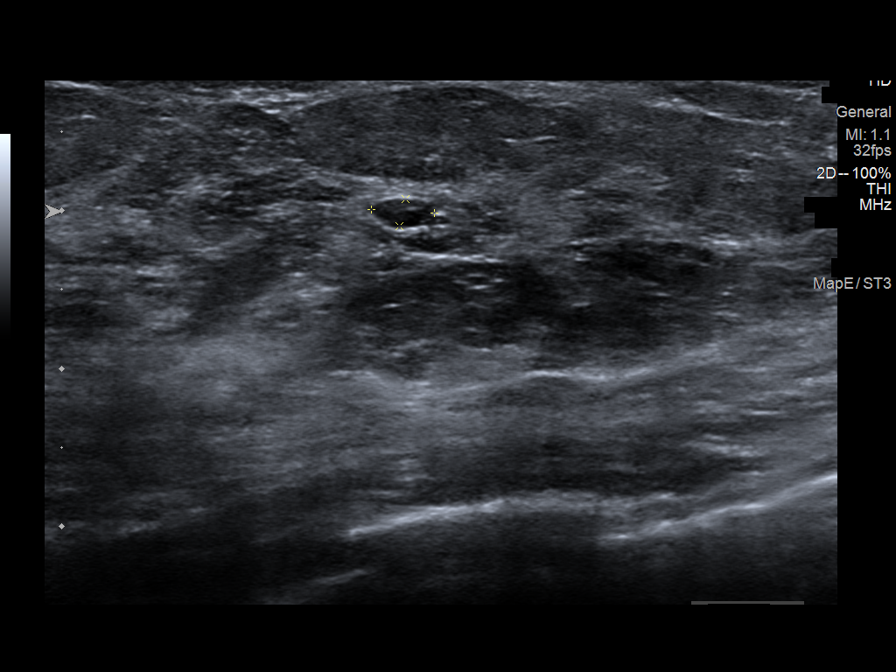
[im 7/8]
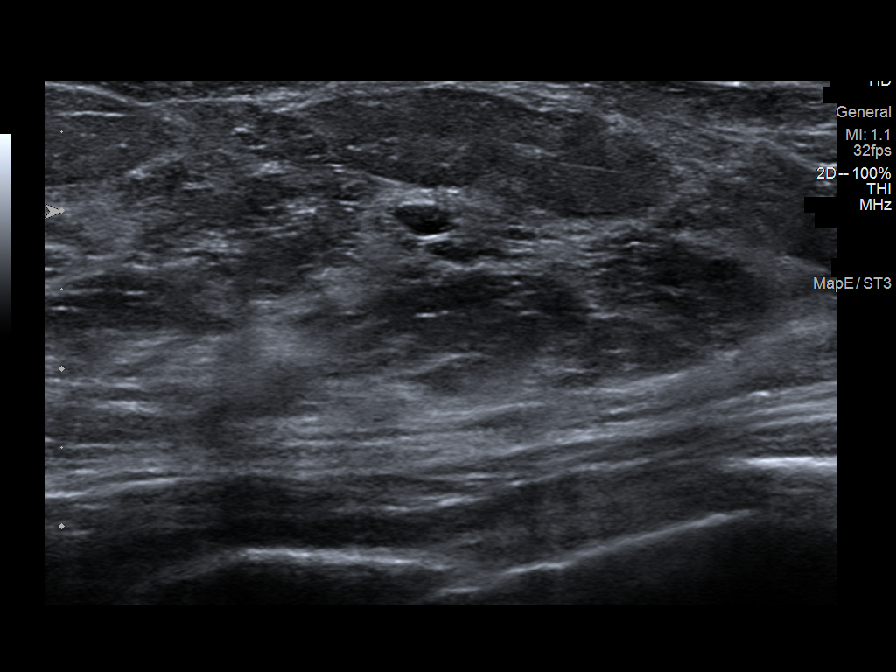
[im 8/8]
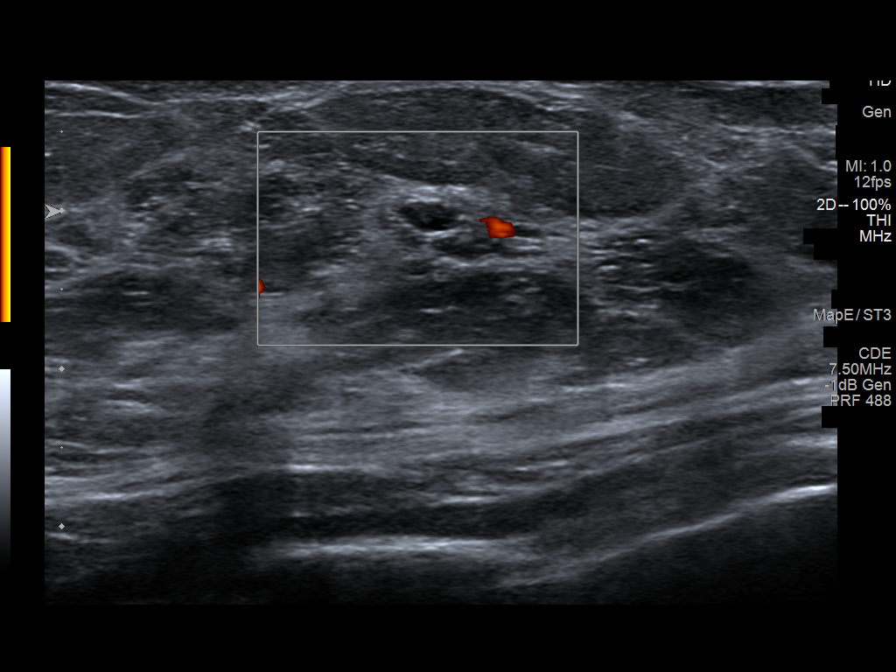

[8 of 8 positions shown; findings below may reference images not displayed]

ACR Breast Density Category b: There are scattered areas of
fibroglandular density.
FINDINGS: 3D tomographic and 2D generated spot compression images of the left
breast demonstrate normal appearing fibroglandular tissue at the
location of the recently suspected 5 mm circumscribed mass in the
upper-outer quadrant of the breast.

Targeted ultrasound is performed, showing a 4 mm cyst with a thin
internal septation and low-level internal echoes in the 2 o'clock
position of the left breast, 6 cm from the nipple. This corresponds
to the mammographic mass.
IMPRESSION: 1. 4 mm benign left breast cyst.
2. No evidence of malignancy.

RECOMMENDATION:
Bilateral screening mammogram in 11 months when due.

I have discussed the findings and recommendations with the patient.
If applicable, a reminder letter will be sent to the patient
regarding the next appointment.

BI-RADS CATEGORY  2: Benign.

## 2021-12-06 ENCOUNTER — Telehealth: Payer: Self-pay

## 2021-12-06 NOTE — Telephone Encounter (Signed)
-----   Message from Loretha Stapler, RN sent at 12/04/2019 11:39 AM EDT -----  repeat MRI/MRCP of the pancreas in 2 years. Thanks

## 2021-12-06 NOTE — Telephone Encounter (Signed)
I called the pt to notify her that it is time for her to have repeat MRI. She delines saying she has some breast issues at this time and wants to call back if/when she decides to proceed.

## 2021-12-26 ENCOUNTER — Encounter: Payer: Self-pay | Admitting: *Deleted

## 2021-12-29 ENCOUNTER — Other Ambulatory Visit: Payer: Self-pay

## 2021-12-29 DIAGNOSIS — K862 Cyst of pancreas: Secondary | ICD-10-CM

## 2022-01-12 ENCOUNTER — Encounter: Payer: Self-pay | Admitting: Family Medicine

## 2022-01-12 ENCOUNTER — Ambulatory Visit (INDEPENDENT_AMBULATORY_CARE_PROVIDER_SITE_OTHER): Payer: 59 | Admitting: Family Medicine

## 2022-01-12 ENCOUNTER — Other Ambulatory Visit (HOSPITAL_COMMUNITY)
Admission: RE | Admit: 2022-01-12 | Discharge: 2022-01-12 | Disposition: A | Discharge status: Home / Self Care | Payer: 59 | Source: Ambulatory Visit | Attending: Family Medicine | Admitting: Family Medicine

## 2022-01-12 VITALS — BP 120/66 | HR 87 | Temp 98.4°F | Ht 65.0 in | Wt 129.6 lb

## 2022-01-12 DIAGNOSIS — J309 Allergic rhinitis, unspecified: Secondary | ICD-10-CM

## 2022-01-12 DIAGNOSIS — Z0000 Encounter for general adult medical examination without abnormal findings: Secondary | ICD-10-CM | POA: Diagnosis not present

## 2022-01-12 DIAGNOSIS — K862 Cyst of pancreas: Secondary | ICD-10-CM | POA: Diagnosis not present

## 2022-01-12 DIAGNOSIS — N941 Unspecified dyspareunia: Secondary | ICD-10-CM | POA: Diagnosis present

## 2022-01-12 LAB — URINALYSIS, ROUTINE W REFLEX MICROSCOPIC
Bilirubin Urine: NEGATIVE
Hgb urine dipstick: NEGATIVE
Ketones, ur: NEGATIVE
Leukocytes,Ua: NEGATIVE
Nitrite: NEGATIVE
RBC / HPF: NONE SEEN (ref 0–?)
Specific Gravity, Urine: 1.015 (ref 1.000–1.030)
Total Protein, Urine: NEGATIVE
Urine Glucose: NEGATIVE
Urobilinogen, UA: 0.2 (ref 0.0–1.0)
pH: 6.5 (ref 5.0–8.0)

## 2022-01-12 LAB — CBC WITH DIFFERENTIAL/PLATELET
Basophils Absolute: 0.1 10*3/uL (ref 0.0–0.1)
Basophils Relative: 0.9 % (ref 0.0–3.0)
Eosinophils Absolute: 0.1 10*3/uL (ref 0.0–0.7)
Eosinophils Relative: 0.8 % (ref 0.0–5.0)
HCT: 41.1 % (ref 36.0–46.0)
Hemoglobin: 13.6 g/dL (ref 12.0–15.0)
Lymphocytes Relative: 22.5 % (ref 12.0–46.0)
Lymphs Abs: 1.5 10*3/uL (ref 0.7–4.0)
MCHC: 33.2 g/dL (ref 30.0–36.0)
MCV: 90.2 fl (ref 78.0–100.0)
Monocytes Absolute: 0.4 10*3/uL (ref 0.1–1.0)
Monocytes Relative: 6.5 % (ref 3.0–12.0)
Neutro Abs: 4.7 10*3/uL (ref 1.4–7.7)
Neutrophils Relative %: 69.3 % (ref 43.0–77.0)
Platelets: 216 10*3/uL (ref 150.0–400.0)
RBC: 4.56 Mil/uL (ref 3.87–5.11)
RDW: 13.2 % (ref 11.5–15.5)
WBC: 6.8 10*3/uL (ref 4.0–10.5)

## 2022-01-12 LAB — COMPREHENSIVE METABOLIC PANEL
ALT: 24 U/L (ref 0–35)
AST: 22 U/L (ref 0–37)
Albumin: 4.2 g/dL (ref 3.5–5.2)
Alkaline Phosphatase: 119 U/L — ABNORMAL HIGH (ref 39–117)
BUN: 14 mg/dL (ref 6–23)
CO2: 29 mEq/L (ref 19–32)
Calcium: 9.1 mg/dL (ref 8.4–10.5)
Chloride: 104 mEq/L (ref 96–112)
Creatinine, Ser: 0.79 mg/dL (ref 0.40–1.20)
GFR: 80.11 mL/min (ref >60.00)
Glucose, Bld: 91 mg/dL (ref 70–99)
Potassium: 3.4 mEq/L — ABNORMAL LOW (ref 3.5–5.1)
Sodium: 140 mEq/L (ref 135–145)
Total Bilirubin: 1.1 mg/dL (ref 0.2–1.2)
Total Protein: 6.9 g/dL (ref 6.0–8.3)

## 2022-01-12 LAB — LIPID PANEL
Cholesterol: 183 mg/dL (ref 0–200)
HDL: 81.5 mg/dL (ref >39.00)
LDL Cholesterol: 82 mg/dL (ref 0–99)
NonHDL: 101.84
Total CHOL/HDL Ratio: 2
Triglycerides: 101 mg/dL (ref 0.0–149.0)
VLDL: 20.2 mg/dL (ref 0.0–40.0)

## 2022-01-12 LAB — LIPASE: Lipase: 50 U/L (ref 11.0–59.0)

## 2022-01-12 LAB — TSH: TSH: 1.93 u[IU]/mL (ref 0.35–5.50)

## 2022-01-12 NOTE — Progress Notes (Signed)
Subjective  Chief Complaint  Patient presents with   Annual Exam    Pt here for Annual Exam and is not currently fasting     HPI: Erika Carlson is a 61 y.o. female who presents to [facility] [place] today for a Female Wellness Visit. She also has the concerns and/or needs as listed above in the chief complaint. These will be addressed in addition to the Health Maintenance Visit.   Wellness Visit: annual visit with health maintenance review and exam without Pap  HM: Healthy lifestyle, exercises regularly, strength training, eats well.  Mammogram is scheduled, sees GYN for female wellness.  Immunizations are up-to-date. Chronic disease f/u and/or acute problem visit: (deemed necessary to be done in addition to the wellness visit): Pancreatic cyst: MRI scheduled for follow-up.  If stable, switch show 5 years of stability and no further imaging will be needed.  She has follow-up with GI.  Chronic diarrhea is stable. Chronic allergies: Lifelong.  Persistent nasal congestion and PND.  Possible food allergies given diarrhea. Complains of a few week history of vaginal discomfort.  Not painful but not normal.  Denies vaginal discharge, odor or urinary symptoms.  Married.  Assessment  1. Annual physical exam   2. Pancreatic cyst   3. Dyspareunia in female   4. Chronic allergic rhinitis      Plan  Female Wellness Visit: Age appropriate Health Maintenance and Prevention measures were discussed with patient. Included topics are cancer screening recommendations, ways to keep healthy (see AVS) including dietary and exercise recommendations, regular eye and dental care, use of seat belts, and avoidance of moderate alcohol use and tobacco use.  Mammogram scheduled BMI: discussed patient's BMI and encouraged positive lifestyle modifications to help get to or maintain a target BMI. HM needs and immunizations were addressed and ordered. See below for orders. See HM and immunization  section for updates. Routine labs and screening tests ordered including cmp, cbc and lipids where appropriate. Discussed recommendations regarding Vit D and calcium supplementation (see AVS)  Chronic disease management visit and/or acute problem visit: Pancreatic cyst: Await MRI report. Chronic allergies and chronic diarrhea: Refer to allergist for further evaluation. Mild dyspareunia, new: Rule out yeast or BV infection.  Check urine.  Follow up: 12 months for complete physical Orders Placed This Encounter  Procedures   Urine Culture   CBC with Differential/Platelet   Comprehensive metabolic panel   Lipid panel   TSH   Urinalysis, Routine w reflex microscopic   Lipase   Ambulatory referral to Allergy   No orders of the defined types were placed in this encounter.     Body mass index is 21.57 kg/m. Wt Readings from Last 3 Encounters:  01/12/22 129 lb 9.6 oz (58.8 kg)  02/03/20 121 lb 6.4 oz (55.1 kg)  11/24/19 118 lb 6.4 oz (53.7 kg)     Patient Active Problem List   Diagnosis Date Noted   Atypical ductal hyperplasia of right breast 03/30/2014    Priority: High   History of chronic hepatitis 02/03/2020    Priority: Medium     Eradicated with Harvoni    Chronic midline low back pain without sciatica 09/18/2019    Priority: Medium    Chronic diarrhea 09/18/2019    Priority: Medium     For decades with negative workup controlled with prn immodium. q 5 year screens.     Osteoarthritis, hip, bilateral 09/02/2018    Priority: Medium    Pancreatic cyst  01/29/2018    Priority: Medium     Repeat MRCP- 05/2019    Kidney stone on left side 01/23/2012    Priority: Medium    History of bipolar disorder 10/13/2010    Priority: Medium     Last treated 2014    Fibrocystic breast disease 08/30/2010    Priority: Low     Biopsy 2011    Health Maintenance  Topic Date Due   MAMMOGRAM  11/25/2020   COVID-19 Vaccine (3 - Pfizer series) 01/28/2022 (Originally 09/01/2019)    TETANUS/TDAP  03/05/2022   COLONOSCOPY (Pts 45-5yrs Insurance coverage will need to be confirmed)  03/04/2023   PAP SMEAR-Modifier  10/23/2024   INFLUENZA VACCINE  Completed   Hepatitis C Screening  Completed   Zoster Vaccines- Shingrix  Completed   HPV VACCINES  Aged Out   HIV Screening  Discontinued   Immunization History  Administered Date(s) Administered   Hep A / Hep B 01/29/2018, 03/04/2018   Influenza, Seasonal, Injecte, Preservative Fre 02/24/2011   Influenza,inj,Quad PF,6+ Mos 02/16/2012, 12/17/2012, 12/02/2013, 04/09/2015, 02/19/2017, 01/03/2022   Influenza,inj,Quad PF,6-35 Mos 02/16/2012   Influenza-Unspecified 01/13/2020   PFIZER(Purple Top)SARS-COV-2 Vaccination 06/15/2019, 07/07/2019   Pneumococcal Polysaccharide-23 03/04/2018   Tdap 03/05/2012   Zoster Recombinat (Shingrix) 10/30/2017, 01/29/2018   We updated and reviewed the patient's past history in detail and it is documented below. Allergies: Patient is allergic to acetaminophen, codeine, lactose, sudafed pe [phenylephrine], and loratadine-pseudoephedrine er. Past Medical History Patient  has a past medical history of Allergy, Arthritis, Colon polyps, Hepatitis C, IBS (irritable bowel syndrome), and Kidney stones. Past Surgical History Patient  has a past surgical history that includes Breast biopsy; Tonsillectomy and adenoidectomy (1971); Wisdom tooth extraction (1977); and Kidney stone surgery (2006). Family History: Patient family history includes Alcohol abuse in her paternal grandfather; Arthritis in her mother and paternal grandmother; Breast cancer in her paternal aunt, paternal aunt, and paternal grandmother; Colon cancer (age of onset: 51) in her paternal uncle; Diabetes in her paternal grandfather; Hearing loss in her father and paternal grandmother; Heart attack in her maternal grandfather; Hyperlipidemia in her father, maternal grandmother, and mother; Hypertension in her father, maternal grandmother, and  mother; Pancreatic cancer in her maternal uncle. Social History:  Patient  reports that she quit smoking about 32 years ago. Her smoking use included cigarettes. She has never used smokeless tobacco. She reports current alcohol use. She reports that she does not use drugs.  Review of Systems: Constitutional: negative for fever or malaise Ophthalmic: negative for photophobia, double vision or loss of vision Cardiovascular: negative for chest pain, dyspnea on exertion, or new LE swelling Respiratory: negative for SOB or persistent cough Gastrointestinal: negative for abdominal pain, change in bowel habits or melena Genitourinary: negative for dysuria or gross hematuria, no abnormal uterine bleeding or disharge Musculoskeletal: negative for new gait disturbance or muscular weakness Integumentary: negative for new or persistent rashes, no breast lumps Neurological: negative for TIA or stroke symptoms Psychiatric: negative for SI or delusions Allergic/Immunologic: negative for hives  Patient Care Team    Relationship Specialty Notifications Start End  Willow Ora, MD PCP - General Family Medicine  09/18/19   Rachael Fee, MD Attending Physician Gastroenterology  02/03/20   Ranae Pila, MD Consulting Physician Obstetrics and Gynecology  02/03/20     Objective  Vitals: BP 120/66   Pulse 87   Temp 98.4 F (36.9 C)   Ht 5\' 5"  (1.651 m)   Wt 129 lb 9.6  oz (58.8 kg)   SpO2 97%   BMI 21.57 kg/m  General:  Well developed, well nourished, no acute distress  Psych:  Alert and orientedx3,normal mood and affect HEENT:  Normocephalic, atraumatic, non-icteric sclera, serous effusion on left, nasal congestion present, supple neck without adenopathy, mass or thyromegaly Cardiovascular:  Normal S1, S2, RRR without gallop, rub or murmur Respiratory:  Good breath sounds bilaterally, CTAB with normal respiratory effort Gastrointestinal: normal bowel sounds, soft, non-tender, no noted  masses. No HSM MSK: no deformities, contusions. Joints are without erythema or swelling.  Skin:  Warm, no rashes or suspicious lesions noted Neurologic:    Mental status is normal.Gross motor and sensory exams are normal. Normal gait. No tremor   Commons side effects, risks, benefits, and alternatives for medications and treatment plan prescribed today were discussed, and the patient expressed understanding of the given instructions. Patient is instructed to call or message via MyChart if he/she has any questions or concerns regarding our treatment plan. No barriers to understanding were identified. We discussed Red Flag symptoms and signs in detail. Patient expressed understanding regarding what to do in case of urgent or emergency type symptoms.  Medication list was reconciled, printed and provided to the patient in AVS. Patient instructions and summary information was reviewed with the patient as documented in the AVS. This note was prepared with assistance of Dragon voice recognition software. Occasional wrong-word or sound-a-like substitutions may have occurred due to the inherent limitations of voice recognition software

## 2022-01-12 NOTE — Patient Instructions (Addendum)
Please return in 12 months for your annual complete physical; please come fasting.   I will release your lab results to you on your MyChart account with further instructions. You may see the results before I do, but when I review them I will send you a message with my report or have my assistant call you if things need to be discussed. Please reply to my message with any questions. Thank you!   If you have any questions or concerns, please don't hesitate to send me a message via MyChart or call the office at [phone]. Thank you for visiting with [place] today! It's our pleasure caring for you.   We will call you with information regarding your referral appointment with an allergist. If you do not hear from [place] within the next 2 weeks, please let me know. It can take 1-2 weeks to get appointments set up with the specialists.    Please do these things to maintain good health!  Exercise at least 30-45 minutes a day,  4-5 days a week.  Eat a low-fat diet with lots of fruits and vegetables, up to 7-9 servings per day. Drink plenty of water daily. Try to drink 8 8oz glasses per day. Seatbelts can save your life. Always wear your seatbelt. Place Smoke Detectors on every level of your home and check batteries every year. Schedule an appointment with an eye doctor for an eye exam every 1-2 years Safe sex - use condoms to protect yourself from STDs if you could be exposed to these types of infections. Use birth control if you do not want to become pregnant and are sexually active. Avoid heavy alcohol use. If you drink, keep it to less than 2 drinks/day and not every day. [place].  Choose someone you trust that could speak for you if you became unable to speak for yourself. Depression is common in our stressful world.If you're feeling down or losing interest in things you normally enjoy, please come in for a visit. If anyone is threatening or hurting you, please get help. Physical or  Emotional Violence is never OK.

## 2022-01-13 LAB — CERVICOVAGINAL ANCILLARY ONLY
Bacterial Vaginitis (gardnerella): NEGATIVE
Candida Glabrata: NEGATIVE
Candida Vaginitis: NEGATIVE
Chlamydia: NEGATIVE
Comment: NEGATIVE
Comment: NEGATIVE
Comment: NEGATIVE
Comment: NEGATIVE
Comment: NORMAL
Neisseria Gonorrhea: NEGATIVE

## 2022-01-14 LAB — URINE CULTURE
MICRO NUMBER:: 14042956
SPECIMEN QUALITY:: ADEQUATE

## 2022-01-16 MED ORDER — NITROFURANTOIN MONOHYD MACRO 100 MG PO CAPS: 100.0000 mg | ORAL_CAPSULE | Freq: Two times a day (BID) | ORAL | 0 refills | Status: DC

## 2022-01-16 NOTE — Addendum Note (Signed)
Addended by: Billey Chang on: 01/16/2022 09:33 AM   Modules accepted: Orders

## 2022-01-19 ENCOUNTER — Ambulatory Visit (HOSPITAL_COMMUNITY): Payer: 59

## 2022-01-23 ENCOUNTER — Telehealth: Payer: Self-pay | Admitting: Gastroenterology

## 2022-01-23 MED ORDER — LORAZEPAM 1 MG PO TABS: ORAL_TABLET | ORAL | 0 refills | Status: DC

## 2022-01-23 NOTE — Telephone Encounter (Signed)
The pt has been advised and prescription faxed

## 2022-01-23 NOTE — Telephone Encounter (Signed)
Patient came into the office saying she wanted a sedative for her MRI scheduled on Thursday at [place] she has been trying to get in contact with a nurse but has not had any success. Advised patient that someone would call her.

## 2022-01-23 NOTE — Telephone Encounter (Signed)
I do not see where anything was given in the past.

## 2022-01-23 NOTE — Telephone Encounter (Signed)
Patty, What do DJ order for her previously for anxiolysis?  If nothing in the past then let me know and then I will give some recommendation. Thanks. GM

## 2022-01-23 NOTE — Telephone Encounter (Signed)
Dr Rush Landmark this pt is scheduled for MRI as follow up for panc cyst.  She is a Dr Ardis Hughs pt so we ordered the MRI under your name.  She is asking for something to take for anxiety prior to the appt.  Can we order?  Her MRI is Thursday at 8 am.

## 2022-01-23 NOTE — Telephone Encounter (Signed)
Patty, Go ahead and prescribe the following: Ativan 1 mg to be taken 2 to 3 hours before imaging.   Ativan 1 mg to be available to take around time of imaging being performed if needed. Please send 2 pills to total no refills. Thanks. GM

## 2022-01-26 ENCOUNTER — Other Ambulatory Visit: Payer: Self-pay | Admitting: Gastroenterology

## 2022-01-26 ENCOUNTER — Ambulatory Visit (HOSPITAL_COMMUNITY): Admission: RE | Admit: 2022-01-26 | Payer: 59 | Source: Ambulatory Visit

## 2022-01-26 ENCOUNTER — Other Ambulatory Visit: Payer: Self-pay

## 2022-01-26 DIAGNOSIS — K862 Cyst of pancreas: Secondary | ICD-10-CM

## 2022-01-26 MED ORDER — LORAZEPAM 1 MG PO TABS: ORAL_TABLET | ORAL | 0 refills | Status: DC

## 2022-01-26 NOTE — Telephone Encounter (Signed)
Inbound call from patient she had to reschedule her MRI and is requesting a refill on ativan. Please advise

## 2022-01-26 NOTE — Telephone Encounter (Signed)
I have spoken to the pt and advised that we can send once more but no further refills-if MRI is not kept at this upcoming appt the pt will need to be seen in the office before rescheduling.

## 2022-01-26 NOTE — Telephone Encounter (Signed)
Dr Rush Landmark the pt no showed her MRI yesterday- she called and rescheduled and now wants a refill on ativan for next appt. Please advise

## 2022-01-26 NOTE — Telephone Encounter (Signed)
Sorry to hear this. She no-showed but now no longer has the Ativan, which is interesting. She can have 1 more similar prescription for Ativan 1 mg to be taken 2 to 3 hours before imaging and then 1 mg to be available to take around the time of imaging if needed.  2 pills in total no refills. If she no-shows again she will need to be seen in clinic to discuss thanks. Thanks. GM

## 2022-01-29 ENCOUNTER — Ambulatory Visit (HOSPITAL_COMMUNITY)
Admission: RE | Admit: 2022-01-29 | Discharge: 2022-01-29 | Disposition: A | Discharge status: Home / Self Care | Payer: 59 | Source: Ambulatory Visit | Attending: Gastroenterology | Admitting: Gastroenterology

## 2022-01-29 DIAGNOSIS — K862 Cyst of pancreas: Secondary | ICD-10-CM | POA: Insufficient documentation

## 2022-01-29 MED ORDER — GADOBUTROL 1 MMOL/ML IV SOLN
6.0000 mL | Freq: Once | INTRAVENOUS | Status: AC | PRN
  Administered 2022-01-29: 6 mL via INTRAVENOUS

## 2022-02-21 ENCOUNTER — Ambulatory Visit: Payer: 59 | Admitting: Internal Medicine

## 2022-03-07 ENCOUNTER — Ambulatory Visit: Payer: 59 | Admitting: Internal Medicine

## 2022-03-13 ENCOUNTER — Ambulatory Visit: Payer: 59 | Admitting: Internal Medicine

## 2022-03-13 ENCOUNTER — Encounter: Payer: Self-pay | Admitting: Internal Medicine

## 2022-03-13 VITALS — BP 120/70 | HR 84 | Temp 98.0°F | Resp 16 | Ht 64.0 in | Wt 125.2 lb

## 2022-03-13 DIAGNOSIS — J310 Chronic rhinitis: Secondary | ICD-10-CM

## 2022-03-13 DIAGNOSIS — R053 Chronic cough: Secondary | ICD-10-CM

## 2022-03-13 DIAGNOSIS — Z889 Allergy status to unspecified drugs, medicaments and biological substances status: Secondary | ICD-10-CM

## 2022-03-13 DIAGNOSIS — E739 Lactose intolerance, unspecified: Secondary | ICD-10-CM | POA: Diagnosis not present

## 2022-03-13 DIAGNOSIS — T50905A Adverse effect of unspecified drugs, medicaments and biological substances, initial encounter: Secondary | ICD-10-CM

## 2022-03-13 MED ORDER — FLUNISOLIDE 25 MCG/ACT (0.025%) NA SOLN: 2.0000 | Freq: Two times a day (BID) | NASAL | 3 refills | Status: DC

## 2022-03-13 NOTE — Patient Instructions (Signed)
Mixed allergic and nonallergic Rhinitis: not well  controlled  -Testing deferred due to [employer] - Will get labs first and if negative  - Continue with: benadryl as needed  - Start taking:  Nasalide 2 sprays in each nostril daily  and Allegra (fexofenadine) 180mg  table once daily Atrovent nasal spray up to 3 times a day as needed for drainage  - You can use an extra dose of the antihistamine, if needed, for breakthrough symptoms.  - Consider nasal saline rinses 1-2 times daily to remove allergens from the nasal cavities as well as help with mucous clearance (this is especially helpful to do before the nasal sprays are given) .  Avoid nonallergic triggers to include strong odors, smoke -Consider mask with known exposure risk  Lactose intolerance  -No indication for further allergy testing- -continue to avoid lactose as needed   Medication sensitivities  -Reactions to medications are not concerning for an IgE mediated drug allergy -We are clearly having adverse drug reactions or side effects -We will focus on minimizing sedating and psychoactive antihistamines and optimizing topical medicines is much as we can  Follow up: 4 weeks   Thank you so much for letting me partake in your care today.  Don't hesitate to reach out if you have any additional concerns!  , MD  [facility] , [place]

## 2022-03-13 NOTE — Progress Notes (Signed)
New Patient Note  RE: Erika Carlson MRN: [id number] DOB: Jul 21, 1960 Date of Office Visit: 03/13/2022  Consult requested by: Willow Ora, MD Primary care provider: Willow Ora, MD  Chief Complaint: Allergic Rhinitis   History of Present Illness: I had the pleasure of seeing [name] for initial evaluation at the [facility] on 03/13/2022. She is a 61 y.o. female, who is referred here by Willow Ora, MD for the evaluation of rhinitis, lactose intolerance.  History obtained from patient, chart review .  Chronic rhinitis: started as a young child and has persisted Symptoms include:  itchy throat, , rhinorrhea, and post nasal drainage  Occurs year-round with seasonal flares during season changes  Potential triggers: strong odors,  Treatments tried: flonase, benadryl  Previous allergy testing: no History of reflux/heartburn: yes more recently with starting advil  History of chronic sinusitis or sinus surgery: no Nonallergic triggers: strong odors and smoke, alcohol   She also reports diarrhea to ice cream, but denies any issues with cheese.  Will control with immodium.   She also reports sensitivity to multiple medications, tylenol, sudaphed, claritin-D, codeine,   Assessment and Plan: Zahlia is a 61 y.o. female with: Chronic rhinitis - Plan: Allergens w/Total IgE Area 2  Chronic cough  Lactose intolerance  Adverse effect of drug, initial encounter Plan: Patient Instructions  Mixed allergic and nonallergic Rhinitis: not well  controlled  -Testing deferred due to [employer] - Will get labs first and if negative  - Continue with: benadryl as needed  - Start taking:  Nasalide 2 sprays in each nostril daily  and Allegra (fexofenadine) 180mg  table once daily Atrovent nasal spray up to 3 times a day as needed for drainage  - You can use an extra dose of the antihistamine, if needed, for breakthrough symptoms.  - Consider nasal  saline rinses 1-2 times daily to remove allergens from the nasal cavities as well as help with mucous clearance (this is especially helpful to do before the nasal sprays are given) .  Avoid nonallergic triggers to include strong odors, smoke -Consider mask with known exposure risk  Lactose intolerance  -No indication for further allergy testing- -continue to avoid lactose as needed   Medication sensitivities  -Reactions to medications are not concerning for an IgE mediated drug allergy -We are clearly having adverse drug reactions or side effects -We will focus on minimizing sedating and psychoactive antihistamines and optimizing topical medicines is much as we can  Follow up: 4 weeks   Thank you so much for letting me partake in your care today.  Don't hesitate to reach out if you have any additional concerns!  , MD  [facility] [place] [place]     Meds ordered this encounter  Medications   flunisolide (NASALIDE) 25 MCG/ACT (0.025%) SOLN    Sig: Place 2 sprays into the nose 2 (two) times daily.    Dispense:  25 mL    Refill:  3   Lab Orders         Allergens w/Total IgE Area 2      Other allergy screening: Asthma: no, previous diagnosis but during an ED visit, she was diagnosed instead with hyperventilation syndrome Rhino conjunctivitis: yes Food allergy:  lactose intolerance Medication allergy:  multiple medication intolerances Hymenoptera allergy: no Urticaria: no Eczema:no History of recurrent infections suggestive of immunodeficency: no  Diagnostics:  Skin Testing:  To [facility] prohibitive insurance policies .  Results interpreted by myself and discussed with patient/family.   Past Medical History: Patient Active Problem List   Diagnosis Date Noted   History of chronic hepatitis 02/03/2020   Chronic midline low back pain without sciatica 09/18/2019   Chronic diarrhea 09/18/2019   Osteoarthritis, hip, bilateral 09/02/2018    Pancreatic cyst 01/29/2018   Atypical ductal hyperplasia of right breast 03/30/2014   Kidney stone on left side 01/23/2012   History of bipolar disorder 10/13/2010   Fibrocystic breast disease 08/30/2010   Past Medical History:  Diagnosis Date   Allergy    Arthritis    Colon polyps    Hepatitis C    treated in 2018   IBS (irritable bowel syndrome)    Kidney stones    Past Surgical History: Past Surgical History:  Procedure Laterality Date   BREAST BIOPSY     2011 and 2015   KIDNEY STONE SURGERY  2006   TONSILLECTOMY AND ADENOIDECTOMY  1971   WISDOM TOOTH EXTRACTION  1977   Medication List:  Current Outpatient Medications  Medication Sig Dispense Refill   diazepam (VALIUM) 5 MG tablet Take 1 tablet 1 hour prior to MRI 1 tablet 0   diphenhydrAMINE (BENADRYL) 25 MG tablet Take 12.5 mg by mouth at bedtime as needed.     flunisolide (NASALIDE) 25 MCG/ACT (0.025%) SOLN Place 2 sprays into the nose 2 (two) times daily. 25 mL 3   fluticasone (FLONASE SENSIMIST) 27.5 MCG/SPRAY nasal spray Place 2 sprays into the nose daily.     loperamide (IMODIUM) 2 MG capsule Take 2 mg by mouth as needed for diarrhea or loose stools.     LORazepam (ATIVAN) 1 MG tablet Take 1 tablet 3 hours prior to the imaging (Patient taking differently: Take 1 mg by mouth. Take 1 tablet 3 hours prior to the imaging) 1 tablet 0   nitrofurantoin, macrocrystal-monohydrate, (MACROBID) 100 MG capsule Take 1 capsule (100 mg total) by mouth 2 (two) times daily. 10 capsule 0   No current facility-administered medications for this visit.   Allergies: Allergies  Allergen Reactions   Acetaminophen Nausea And Vomiting   Codeine Nausea And Vomiting   Lactose Other (See Comments)   Sudafed Pe [Phenylephrine] Itching    Any decongestant    Loratadine-Pseudoephedrine Er Anxiety    PN: high ANX required sedation "until med effects wore off"   Social History: Social History   Socioeconomic History   Marital status:  Married    Spouse name: Not on file   Number of children: 3   Years of education: Not on file   Highest education level: Not on file  Occupational History   Occupation: Therapist, music  Tobacco Use   Smoking status: Former    Types: Cigarettes    Quit date: 09/17/1989    Years since quitting: 32.5   Smokeless tobacco: Never  Vaping Use   Vaping Use: Never used  Substance and Sexual Activity   Alcohol use: Yes    Comment: 1 drink per week   Drug use: Never   Sexual activity: Not on file  Other Topics Concern   Not on file  Social History Narrative   Moved from [place] 2021   Social Determinants of Health   Financial Resource Strain: Not on file  Food Insecurity: Not on file  Transportation Needs: Not on file  Physical Activity: Not on file  Stress: Not on file  Social Connections: Not on file   Lives in a single-family home that is  62 years old, there are no roaches in the house advised to get off the floor.  She has a dust mite precautions on bed and pillows.  Status post to fumes, chemicals or dust.  There is no HEPA filter in the home [place] Smoking: No exposure Occupation: Environmental consultant HistorySurveyor, minerals in the house: no Engineer, civil (consulting) in the family room: yes Carpet in the bedroom: yes Heating: electric Cooling: central Pet: yes 1 cat with access to bedroom  Family History: Family History  Problem Relation Age of Onset   Arthritis Mother    Hypertension Mother    Hyperlipidemia Mother    Hearing loss Father    Hypertension Father    Hyperlipidemia Father    Hypertension Maternal Grandmother    Hyperlipidemia Maternal Grandmother    Heart attack Maternal Grandfather    Arthritis Paternal Grandmother    Hearing loss Paternal Grandmother    Breast cancer Paternal Grandmother    Alcohol abuse Paternal Grandfather    Diabetes Paternal Grandfather    Breast cancer Paternal Aunt    Breast cancer  Paternal Aunt    Pancreatic cancer Maternal Uncle    Colon cancer Paternal Uncle 60     ROS: All others negative except as noted per HPI.   Objective: BP 120/70 (BP Location: Right Arm, Patient Position: Sitting, Cuff Size: Normal)   Pulse 84   Temp 98 F (36.7 C) (Temporal)   Resp 16   Ht 5\' 4"  (1.626 m)   Wt 125 lb 3.2 oz (56.8 kg)   SpO2 98%   BMI 21.49 kg/m  Body mass index is 21.49 kg/m.  General Appearance:  Alert, cooperative, no distress, appears stated age  Head:  Normocephalic, without obvious abnormality, atraumatic  Eyes:  Conjunctiva clear, EOM's intact  Nose: Nares normal,  erythematous nasal mucosa, hypertrophic turbinates, no visible anterior polyps, and septum midline  Throat: Lips, tongue normal; teeth and gums normal, no tonsillar exudate and + cobblestoning  Neck: Supple, symmetrical  Lungs:   clear to auscultation bilaterally, Respirations unlabored, no coughing  Heart:  regular rate and rhythm and no murmur, Appears well perfused  Extremities: No edema  Skin: Skin color, texture, turgor normal, no rashes or lesions on visualized portions of skin  Neurologic: No gross deficits   The plan was reviewed with the patient/family, and all questions/concerned were addressed.  It was my pleasure to see Myles today and participate in her care. Please feel free to contact me with any questions or concerns.  Sincerely,  , MD Allergy & Immunology  [facility] office: [phone] [facility] office: [phone]

## 2022-03-15 LAB — ALLERGENS W/TOTAL IGE AREA 2
Alternaria Alternata IgE: <0.1 kU/L
Aspergillus Fumigatus IgE: <0.1 kU/L
Bermuda Grass IgE: <0.1 kU/L
Cat Dander IgE: <0.1 kU/L
Cedar, Mountain IgE: <0.1 kU/L
Cladosporium Herbarum IgE: <0.1 kU/L
Cockroach, German IgE: <0.1 kU/L
Common Silver Birch IgE: <0.1 kU/L
Cottonwood IgE: <0.1 kU/L
D Farinae IgE: <0.1 kU/L
D Pteronyssinus IgE: <0.1 kU/L
Dog Dander IgE: <0.1 kU/L
Elm, American IgE: <0.1 kU/L
IgE (Immunoglobulin E), Serum: 3 IU/mL — ABNORMAL LOW (ref 6–495)
Johnson Grass IgE: <0.1 kU/L
Maple/Box Elder IgE: <0.1 kU/L
Mouse Urine IgE: <0.1 kU/L
Oak, White IgE: <0.1 kU/L
Pecan, Hickory IgE: <0.1 kU/L
Penicillium Chrysogen IgE: <0.1 kU/L
Pigweed, Rough IgE: <0.1 kU/L
Ragweed, Short IgE: <0.1 kU/L
Sheep Sorrel IgE Qn: <0.1 kU/L
Timothy Grass IgE: <0.1 kU/L
White Mulberry IgE: <0.1 kU/L

## 2022-03-17 NOTE — Progress Notes (Signed)
Blood work returned all negative for the environmental panel.  I do recommend patient schedule skin testing visit.  Can someone let patient know?

## 2022-04-10 ENCOUNTER — Ambulatory Visit: Payer: 59 | Admitting: Internal Medicine

## 2022-04-10 ENCOUNTER — Other Ambulatory Visit: Payer: Self-pay

## 2022-04-10 VITALS — Temp 98.2°F | Wt 127.9 lb

## 2022-04-10 DIAGNOSIS — E739 Lactose intolerance, unspecified: Secondary | ICD-10-CM

## 2022-04-10 DIAGNOSIS — J310 Chronic rhinitis: Secondary | ICD-10-CM | POA: Diagnosis not present

## 2022-04-10 DIAGNOSIS — J3089 Other allergic rhinitis: Secondary | ICD-10-CM

## 2022-04-10 DIAGNOSIS — T50905D Adverse effect of unspecified drugs, medicaments and biological substances, subsequent encounter: Secondary | ICD-10-CM

## 2022-04-10 NOTE — Patient Instructions (Addendum)
Nonnallergic Rhinitis:  Allergy testing today showed: Negative to all environmentals Previous blood work was negative to all environmentals as well Symptoms are likely triggered by irritants Allergy injections are not an option at this time Focus on technique for nasal sprays to improve how well they work  - Continue taking:  Nasalide 2 sprays in each nostril daily  and Allegra (fexofenadine) 180mg  table once daily Atrovent nasal spray up to 3 times a day as needed for drainage  - You can use an extra dose of the antihistamine, if needed, for breakthrough symptoms.  - Consider nasal saline rinses 1-2 times daily to remove allergens from the nasal cavities as well as help with mucous clearance (this is especially helpful to do before the nasal sprays are given) .  Avoid nonallergic triggers to include strong odors, smoke -Consider mask with known exposure risk  Lactose intolerance  -continue to avoid lactose as needed   Medication sensitivities  -Reactions to medications are not concerning for an IgE mediated drug allergy -We are clearly having adverse drug reactions or side effects -We will focus on minimizing sedating and psychoactive antihistamines and optimizing topical medicines is much as we can  Follow up:  6 month   Thank you so much for letting me partake in your care today.  Don't hesitate to reach out if you have any additional concerns!  Roney Marion, MD  [facility] [place], [place]

## 2022-04-10 NOTE — Progress Notes (Signed)
Date of Service/Encounter:  04/10/22  Allergy testing appointment   Initial visit on 03/13/22, seen for allergic rhinitis, lactose intolerance, .  Please see that note for additional details.  Today reports for allergy diagnostic testing:    DIAGNOSTICS:  Skin Testing: Environmental allergy panel and select foods. Negative to all environmentals and select foods  adequate positive and negative controls Results discussed with patient/family.  Airborne Adult Perc - 04/10/22 0850     Time Antigen Placed 0840    Allergen Manufacturer Lavella Hammock    Location Back    Number of Test 58    Panel 1 Select    1. Control-Buffer 50% Glycerol Negative    2. Control-Histamine 1 mg/ml 4+    3. Albumin saline Negative    4. Evendale Negative    5. [place] Negative    6. Johnson Negative    7. Rapids Blue Negative    9. Perennial Rye Negative    10. Sweet Vernal Negative    12. Cocklebur Negative    13. Burweed Marshelder Negative    14. Ragweed, short Negative    15. Ragweed, Giant Negative    16. Plantain,  English Negative    17. Lamb's Quarters Negative    18. Sheep Sorrell Negative    19. Rough Pigweed Negative    20. Marsh Elder, Rough Negative    21. Mugwort, Common Negative    22. Ash mix Negative    23. Birch mix Negative    24. Beech American Negative    25. Box, Elder Negative    26. Cedar, red Negative    27. Cottonwood, [place] Negative    28. Elm mix Negative    29. Hickory Negative    30. Maple mix Negative    31. Oak, [place] mix Negative    32. Pecan Pollen Negative    33. Pine mix Negative    34. Sycamore Eastern Negative    35. Gouldsboro, Black Pollen Negative    36. Alternaria alternata Negative    37. Cladosporium Herbarum Negative    38. Aspergillus mix Negative    39. Penicillium mix Negative    40. Bipolaris sorokiniana (Helminthosporium) Negative    41. Drechslera spicifera (Curvularia) Negative    42. Mucor plumbeus Negative    43. Fusarium moniliforme  Negative    44. Aureobasidium pullulans (pullulara) Negative    45. Rhizopus oryzae Negative    46. Botrytis cinera Negative    47. Epicoccum nigrum Negative    48. Phoma betae Negative    49. Candida Albicans Negative    50. Trichophyton mentagrophytes Negative    51. Mite, D Farinae  5,000 AU/ml Negative    52. Mite, D Pteronyssinus  5,000 AU/ml Negative    53. Cat Hair 10,000 BAU/ml Negative    54.  Dog Epithelia Negative    55. Mixed Feathers Negative    56. Horse Epithelia Negative    57. Cockroach, German Negative    58. Mouse Negative    59. Tobacco Leaf Negative    Other Omitted    Other Omitted             Food Perc - 04/10/22 0851       Test Information   Time Antigen Placed 0840    Allergen Manufacturer Lavella Hammock    Location Back    Number of allergen test [place]   1. Peanut Negative    2.  Soybean food Negative    3. Wheat, whole Negative    4. Sesame Negative    5. Milk, cow Negative    6. Egg White, chicken Negative    7. Casein Negative    8. Shellfish mix Negative    9. Fish mix Negative    10. Cashew Negative             Allergy testing results were read and interpreted by myself, documented by clinical staff.  Patient provided with copy of allergy testing along with avoidance measures when indicated.   Ferol Luz, MD  [facility]

## 2022-05-02 ENCOUNTER — Encounter: Payer: Self-pay | Admitting: Family Medicine

## 2022-05-03 MED ORDER — SCOPOLAMINE 1 MG/3DAYS TD PT72: 1.0000 | MEDICATED_PATCH | TRANSDERMAL | 0 refills | Status: DC

## 2022-06-08 ENCOUNTER — Other Ambulatory Visit: Payer: Self-pay | Admitting: Internal Medicine

## 2022-07-05 ENCOUNTER — Other Ambulatory Visit: Payer: Self-pay | Admitting: Internal Medicine

## 2022-07-11 ENCOUNTER — Other Ambulatory Visit: Payer: Self-pay | Admitting: Internal Medicine

## 2022-09-26 ENCOUNTER — Encounter: Payer: Self-pay | Admitting: Internal Medicine

## 2022-09-26 ENCOUNTER — Ambulatory Visit: Payer: 59 | Admitting: Internal Medicine

## 2022-09-26 VITALS — BP 124/82 | HR 85 | Temp 98.1°F | Resp 16

## 2022-09-26 DIAGNOSIS — Z888 Allergy status to other drugs, medicaments and biological substances status: Secondary | ICD-10-CM | POA: Diagnosis not present

## 2022-09-26 DIAGNOSIS — J310 Chronic rhinitis: Secondary | ICD-10-CM

## 2022-09-26 DIAGNOSIS — E739 Lactose intolerance, unspecified: Secondary | ICD-10-CM

## 2022-09-26 DIAGNOSIS — T50905D Adverse effect of unspecified drugs, medicaments and biological substances, subsequent encounter: Secondary | ICD-10-CM

## 2022-09-26 MED ORDER — FLUNISOLIDE 25 MCG/ACT (0.025%) NA SOLN: 2.0000 | Freq: Two times a day (BID) | NASAL | 3 refills | Status: AC

## 2022-09-26 NOTE — Patient Instructions (Addendum)
Nonnallergic Rhinitis: controlled  Negative allergy testing 04/10/22   - Continue taking:  Nasalide 2 sprays in each nostril daily  and Allegra (fexofenadine) 180mg  table once daily - You can use an extra dose of the antihistamine, if needed, for breakthrough symptoms.  - Consider nasal saline rinses 1-2 times daily to remove allergens from the nasal cavities as well as help with mucous clearance (this is especially helpful to do before the nasal sprays are given) .  Avoid nonallergic triggers to include strong odors, smoke -Consider mask with known exposure risk  Lactose intolerance  -continue to avoid lactose as needed   Medication sensitivities: tyenol, sudaphed, claritin-D, codeine  -Continue avoidance  -We will focus on minimizing sedating and psychoactive antihistamines and optimizing topical medicines is much as we can  Follow up:  12 motnhs   Thank you so much for letting me partake in your care today.  Don't hesitate to reach out if you have any additional concerns!  Ferol Luz, MD  [facility] [place] [place]

## 2022-09-26 NOTE — Progress Notes (Signed)
Follow Up Note  RE: Erika Carlson MRN: [id number] DOB: 11-06-1960 Date of Office Visit: 09/26/2022  Referring provider: Willow Ora, MD Primary care provider: Willow Ora, MD  Chief Complaint: Allergies  History of Present Illness: I had the pleasure of seeing Erika Carlson for a follow up visit at the [facility] on 09/26/2022. She is a 62 y.o. female, who is being followed for nonallergic rhinitis, medication sensitivities, lactose intolerance. Her previous allergy office visit was on 04/10/22 with Dr. Marlynn Perking. Today is a regular follow up visit.  History obtained from patient, chart review .  Today she reports; Overall nasal symptoms are well controlled on allegra and flonase 1 SEN daily Cough has completely resolved  Will have mild breakthrough symptoms with cat and high pollen season.  Will treat breakthrough drainage with 12.5mg  of benadryl at night.  Decreased sensitivity to perfume and strong odors.    Lactose intolerance is controlled with immodium as needed.   Is not interested in adding any additional medications   Pertinent History/Diagnostics:  - Allergic Rhinitis: irritant and gustatory, PND, rhinonrrhea, itchy throat   - SPT environmental panel (04/10/22): negative to all environmentals and select foods   - sIgE (04/10/22): negative   - On Allegra, nasalide, atrovent - Food Intolerance: lactose: diarrrhea with ice cream, treats with immodium  - Drug Allery multiple medication sensitivities   - tyenol, sudaphed, claritin-D, codeine    Assessment and Plan: [name] is a 62 y.o. female with: Nonallergic rhinitis  Adverse effect of drug, subsequent encounter  Lactose intolerance   Plan: Patient Instructions  Nonnallergic Rhinitis: controlled  Negative allergy testing 04/10/22   - Continue taking:  Nasalide 2 sprays in each nostril daily  and Allegra (fexofenadine) 180mg  table once daily - You can use an extra dose of the antihistamine,  if needed, for breakthrough symptoms.  - Consider nasal saline rinses 1-2 times daily to remove allergens from the nasal cavities as well as help with mucous clearance (this is especially helpful to do before the nasal sprays are given) .  Avoid nonallergic triggers to include strong odors, smoke -Consider mask with known exposure risk  Lactose intolerance  -continue to avoid lactose as needed   Medication sensitivities: tyenol, sudaphed, claritin-D, codeine  -Continue avoidance  -We will focus on minimizing sedating and psychoactive antihistamines and optimizing topical medicines is much as we can  Follow up:  12 motnhs   Thank you so much for letting me partake in your care today.  Don't hesitate to reach out if you have any additional concerns!  Ferol Luz, MD  [facility] [place] [place]    Meds ordered this encounter  Medications   flunisolide (NASALIDE) 25 MCG/ACT (0.025%) SOLN    Sig: Place 2 sprays into the nose 2 (two) times daily.    Dispense:  75 mL    Refill:  3    Lab Orders  No laboratory test(s) ordered today   Diagnostics: None needed    Medication List:  Current Outpatient Medications  Medication Sig Dispense Refill   diazepam (VALIUM) 5 MG tablet Take 1 tablet 1 hour prior to MRI 1 tablet 0   fexofenadine (ALLEGRA) 180 MG tablet Take 180 mg by mouth daily.     loperamide (IMODIUM) 2 MG capsule Take 2 mg by mouth as needed for diarrhea or loose stools.     flunisolide (NASALIDE) 25 MCG/ACT (0.025%) SOLN Place 2 sprays into the nose 2 (  two) times daily. 75 mL 3   No current facility-administered medications for this visit.   Allergies: Allergies  Allergen Reactions   Acetaminophen Nausea And Vomiting   Codeine Nausea And Vomiting   Lactose Other (See Comments)   Sudafed Pe [Phenylephrine] Itching    Any decongestant causes agitation    Loratadine-Pseudoephedrine Er Anxiety    PN: high ANX required sedation "until med effects  wore off"   I reviewed her past medical history, social history, family history, and environmental history and no significant changes have been reported from her previous visit.  ROS: All others negative except as noted per HPI.   Objective: BP 124/82   Pulse 85   Temp 98.1 F (36.7 C) (Temporal)   Resp 16   SpO2 97%  There is no height or weight on file to calculate BMI. General Appearance:  Alert, cooperative, no distress, appears stated age  Head:  Normocephalic, without obvious abnormality, atraumatic  Eyes:  Conjunctiva clear, EOM's intact  Nose: Nares normal,  erythematous nasal mucosa, no visible anterior polyps, and septum midline  Throat: Lips, tongue normal; teeth and gums normal, normal posterior oropharynx  Neck: Supple, symmetrical  Lungs:   clear to auscultation bilaterally, Respirations unlabored, no coughing  Heart:  regular rate and rhythm and no murmur, Appears well perfused  Extremities: No edema  Skin: Skin color, texture, turgor normal, no rashes or lesions on visualized portions of skin  Neurologic: No gross deficits   Previous notes and tests were reviewed. The plan was reviewed with the patient/family, and all questions/concerned were addressed.  It was my pleasure to see Erika Carlson today and participate in her care. Please feel free to contact me with any questions or concerns.  Sincerely,  Ferol Luz, MD  Allergy & Immunology  [facility] Office: [phone]

## 2023-03-15 ENCOUNTER — Ambulatory Visit: Payer: 59

## 2023-08-09 ENCOUNTER — Other Ambulatory Visit: Payer: Self-pay | Admitting: Obstetrics and Gynecology

## 2023-08-09 DIAGNOSIS — Z803 Family history of malignant neoplasm of breast: Secondary | ICD-10-CM

## 2023-09-16 ENCOUNTER — Ambulatory Visit
Admission: RE | Admit: 2023-09-16 | Discharge: 2023-09-16 | Disposition: A | Discharge status: Home / Self Care | Source: Ambulatory Visit | Attending: Obstetrics and Gynecology | Admitting: Obstetrics and Gynecology

## 2023-09-16 DIAGNOSIS — Z803 Family history of malignant neoplasm of breast: Secondary | ICD-10-CM

## 2023-09-16 MED ORDER — GADOPICLENOL 0.5 MMOL/ML IV SOLN
6.0000 mL | Freq: Once | INTRAVENOUS | Status: AC | PRN
  Administered 2023-09-16: 6 mL via INTRAVENOUS

## 2024-02-01 ENCOUNTER — Telehealth: Payer: Self-pay

## 2024-02-01 NOTE — Telephone Encounter (Signed)
-----   Message from Nurse Eliyanah Elgersma P sent at 01/31/2022  9:00 AM EDT -----  plan for 2-year follow-up MRI/MRCP.

## 2024-02-01 NOTE — Telephone Encounter (Signed)
 Left message on machine to call back

## 2024-02-04 NOTE — Telephone Encounter (Signed)
Unable to reach pt by phone letter mailed.  

## 2024-02-04 NOTE — Telephone Encounter (Signed)
 Left message on machine to call back
